# Patient Record
Sex: Male | Born: 1980 | Race: White | Hispanic: No | Marital: Single | Smoking: Current every day smoker
Health system: Southern US, Community
[De-identification: ages and names within clinical notes are randomized; demographics above are authoritative.]

## PROBLEM LIST (undated history)

## (undated) DIAGNOSIS — I10 Essential (primary) hypertension: Secondary | ICD-10-CM

## (undated) DIAGNOSIS — K5792 Diverticulitis of intestine, part unspecified, without perforation or abscess without bleeding: Secondary | ICD-10-CM

## (undated) DIAGNOSIS — E119 Type 2 diabetes mellitus without complications: Secondary | ICD-10-CM

## (undated) HISTORY — PX: KNEE ARTHROPLASTY: SHX992

## (undated) HISTORY — PX: WRIST SURGERY: SHX841

## (undated) HISTORY — PX: FOOT SURGERY: SHX648

---

## 1998-05-26 ENCOUNTER — Encounter: Payer: Self-pay | Admitting: Emergency Medicine

## 1998-05-26 ENCOUNTER — Emergency Department (HOSPITAL_COMMUNITY): Admission: EM | Admit: 1998-05-26 | Discharge: 1998-05-26 | Payer: Self-pay | Admitting: Emergency Medicine

## 1998-09-09 ENCOUNTER — Ambulatory Visit (HOSPITAL_COMMUNITY): Admission: RE | Admit: 1998-09-09 | Discharge: 1998-09-09 | Payer: Self-pay | Admitting: *Deleted

## 2010-11-13 ENCOUNTER — Emergency Department (HOSPITAL_COMMUNITY)
Admission: EM | Admit: 2010-11-13 | Discharge: 2010-11-13 | Disposition: A | Discharge status: Home / Self Care | Payer: Self-pay | Attending: Emergency Medicine | Admitting: Emergency Medicine

## 2010-11-13 DIAGNOSIS — R209 Unspecified disturbances of skin sensation: Secondary | ICD-10-CM | POA: Insufficient documentation

## 2010-11-13 DIAGNOSIS — M5430 Sciatica, unspecified side: Secondary | ICD-10-CM | POA: Insufficient documentation

## 2010-11-13 DIAGNOSIS — M545 Low back pain, unspecified: Secondary | ICD-10-CM | POA: Insufficient documentation

## 2010-11-13 DIAGNOSIS — M79609 Pain in unspecified limb: Secondary | ICD-10-CM | POA: Insufficient documentation

## 2015-04-05 ENCOUNTER — Emergency Department (HOSPITAL_COMMUNITY)
Admission: EM | Admit: 2015-04-05 | Discharge: 2015-04-05 | Disposition: A | Discharge status: Home / Self Care | Payer: BLUE CROSS/BLUE SHIELD | Attending: Emergency Medicine | Admitting: Emergency Medicine

## 2015-04-05 ENCOUNTER — Encounter (HOSPITAL_COMMUNITY): Payer: Self-pay | Admitting: Emergency Medicine

## 2015-04-05 DIAGNOSIS — K088 Other specified disorders of teeth and supporting structures: Secondary | ICD-10-CM | POA: Diagnosis present

## 2015-04-05 DIAGNOSIS — Z720 Tobacco use: Secondary | ICD-10-CM | POA: Insufficient documentation

## 2015-04-05 DIAGNOSIS — K029 Dental caries, unspecified: Secondary | ICD-10-CM | POA: Diagnosis not present

## 2015-04-05 MED ORDER — HYDROCODONE-ACETAMINOPHEN 5-325 MG PO TABS: 1.0000 | ORAL_TABLET | ORAL | Status: DC | PRN

## 2015-04-05 MED ORDER — PENICILLIN V POTASSIUM 500 MG PO TABS: 500.0000 mg | ORAL_TABLET | Freq: Three times a day (TID) | ORAL | Status: DC

## 2015-04-05 MED ORDER — IBUPROFEN 800 MG PO TABS: 800.0000 mg | ORAL_TABLET | Freq: Three times a day (TID) | ORAL | Status: DC

## 2015-04-05 MED ORDER — OXYCODONE-ACETAMINOPHEN 5-325 MG PO TABS
1.0000 | ORAL_TABLET | Freq: Once | ORAL | Status: AC
  Administered 2015-04-05: 1 via ORAL
  Filled 2015-04-05: qty 1

## 2015-04-05 NOTE — Discharge Instructions (Signed)
Dental Care and Dentist Visits °Dental care supports good overall health. Regular dental visits can also help you avoid dental pain, bleeding, infection, and other more serious health problems in the future. It is important to keep the mouth healthy because diseases in the teeth, gums, and other oral tissues can spread to other areas of the body. Some problems, such as diabetes, heart disease, and pre-term labor have been associated with poor oral health.  °See your dentist every 6 months. If you experience emergency problems such as a toothache or broken tooth, go to the dentist right away. If you see your dentist regularly, you may catch problems early. It is easier to be treated for problems in the early stages.  °WHAT TO EXPECT AT A DENTIST VISIT  °Your dentist will look for many common oral health problems and recommend proper treatment. At your regular dental visit, you can expect: °· Gentle cleaning of the teeth and gums. This includes scraping and polishing. This helps to remove the sticky substance around the teeth and gums (plaque). Plaque forms in the mouth shortly after eating. Over time, plaque hardens on the teeth as tartar. If tartar is not removed regularly, it can cause problems. Cleaning also helps remove stains. °· Periodic X-rays. These pictures of the teeth and supporting bone will help your dentist assess the health of your teeth. °· Periodic fluoride treatments. Fluoride is a natural mineral shown to help strengthen teeth. Fluoride treatment involves applying a fluoride gel or varnish to the teeth. It is most commonly done in children. °· Examination of the mouth, tongue, jaws, teeth, and gums to look for any oral health problems, such as: °· Cavities (dental caries). This is decay on the tooth caused by plaque, sugar, and acid in the mouth. It is best to catch a cavity when it is small. °· Inflammation of the gums caused by plaque buildup (gingivitis). °· Problems with the mouth or malformed  or misaligned teeth. °· Oral cancer or other diseases of the soft tissues or jaws.  °KEEP YOUR TEETH AND GUMS HEALTHY °For healthy teeth and gums, follow these general guidelines as well as your dentist's specific advice: °· Have your teeth professionally cleaned at the dentist every 6 months. °· Brush twice daily with a fluoride toothpaste. °· Floss your teeth daily.  °· Ask your dentist if you need fluoride supplements, treatments, or fluoride toothpaste. °· Eat a healthy diet. Reduce foods and drinks with added sugar. °· Avoid smoking. °TREATMENT FOR ORAL HEALTH PROBLEMS °If you have oral health problems, treatment varies depending on the conditions present in your teeth and gums. °· Your caregiver will most likely recommend good oral hygiene at each visit. °· For cavities, gingivitis, or other oral health disease, your caregiver will perform a procedure to treat the problem. This is typically done at a separate appointment. Sometimes your caregiver will refer you to another dental specialist for specific tooth problems or for surgery. °SEEK IMMEDIATE DENTAL CARE IF: °· You have pain, bleeding, or soreness in the gum, tooth, jaw, or mouth area. °· A permanent tooth becomes loose or separated from the gum socket. °· You experience a blow or injury to the mouth or jaw area. °Document Released: 05/31/2011 Document Revised: 12/11/2011 Document Reviewed: 05/31/2011 °ExitCare® Patient Information ©2015 [employer]. This information is not intended to replace advice given to you by your health care provider. Make sure you discuss any questions you have with your health care provider. ° °Dental Caries °Dental caries is tooth decay. This   decay can cause a hole in teeth (cavity) that can get bigger and deeper over time. °HOME CARE °· Brush and floss your teeth. Do this at least two times a day. °· Use a fluoride toothpaste. °· Use a mouth rinse if told by your dentist or doctor. °· Eat less sugary and starchy foods.  Drink less sugary drinks. °· Avoid snacking often on sugary and starchy foods. Avoid sipping often on sugary drinks. °· Keep regular checkups and cleanings with your dentist. °· Use fluoride supplements if told by your dentist or doctor. °· Allow fluoride to be applied to teeth if told by your dentist or doctor. °Document Released: 06/27/2008 Document Revised: 02/02/2014 Document Reviewed: 09/20/2012 °ExitCare® Patient Information ©2015 [employer]. This information is not intended to replace advice given to you by your health care provider. Make sure you discuss any questions you have with your health care provider. ° °Dental Pain °A tooth ache may be caused by cavities (tooth decay). Cavities expose the nerve of the tooth to air and hot or cold temperatures. It may come from an infection or abscess (also called a boil or furuncle) around your tooth. It is also often caused by dental caries (tooth decay). This causes the pain you are having. °DIAGNOSIS  °Your caregiver can diagnose this problem by exam. °TREATMENT  °· If caused by an infection, it may be treated with medications which kill germs (antibiotics) and pain medications as prescribed by your caregiver. Take medications as directed. °· Only take over-the-counter or prescription medicines for pain, discomfort, or fever as directed by your caregiver. °· Whether the tooth ache today is caused by infection or dental disease, you should see your dentist as soon as possible for further care. °SEEK MEDICAL CARE IF: °The exam and treatment you received today has been provided on an emergency basis only. This is not a substitute for complete medical or dental care. If your problem worsens or new problems (symptoms) appear, and you are unable to meet with your dentist, call or return to this location. °SEEK IMMEDIATE MEDICAL CARE IF:  °· You have a fever. °· You develop redness and swelling of your face, jaw, or neck. °· You are unable to open your mouth. °· You  have severe pain uncontrolled by pain medicine. °MAKE SURE YOU:  °· Understand these instructions. °· Will watch your condition. °· Will get help right away if you are not doing well or get worse. °Document Released: 09/18/2005 Document Revised: 12/11/2011 Document Reviewed: 05/06/2008 °ExitCare® Patient Information ©2015 [employer]. This information is not intended to replace advice given to you by your health care provider. Make sure you discuss any questions you have with your health care provider. ° °Emergency Department Resource Guide °1) Find a Doctor and Pay Out of Pocket °Although you won't have to find out who is covered by your insurance plan, it is a good idea to ask around and get recommendations. You will then need to call the office and see if the doctor you have chosen will accept you as a new patient and what types of options they offer for patients who are self-pay. Some doctors offer discounts or will set up payment plans for their patients who do not have insurance, but you will need to ask so you aren't surprised when you get to your appointment. ° °2) Contact Your Local Health Department °Not all health departments have doctors that can see patients for sick visits, but many do, so it is worth   a call to see if yours does. If you don't know where your local health department is, you can check in your phone book. The CDC also has a tool to help you locate your state's health department, and many state websites also have listings of all of their local health departments. ° °3) Find a Walk-in Clinic °If your illness is not likely to be very severe or complicated, you may want to try a walk in clinic. These are popping up all over the country in pharmacies, drugstores, and shopping centers. They're usually staffed by nurse practitioners or physician assistants that have been trained to treat common illnesses and complaints. They're usually fairly quick and inexpensive. However, if you have serious  medical issues or chronic medical problems, these are probably not your best option. ° °No Primary Care Doctor: °- Call [facility] at  832-8000 - they can help you locate a primary care doctor that  accepts your insurance, provides certain services, etc. °- Physician Referral Service- 1-800-533-3463 ° °Chronic Pain Problems: °Organization         Address  Phone   Notes  °[facility]  ([phone]) [phone] Patients need to be referred by their primary care doctor.  ° °Medication Assistance: °Organization         Address  Phone   Notes  °[place] [facility] [address] °[place], [place] [address] (336) 641-8030 --Must be a resident of [place] °-- Must have NO insurance coverage whatsoever (no Medicaid/ Medicare, etc.) °-- The pt. MUST have a primary care doctor that directs their care regularly and follows them in the community °  °[facility]  (866) 331-1348   °[employer]  (888) 892-1162   ° °Agencies that provide inexpensive medical care: °Organization         Address  Phone   Notes  °[facility]  ([phone]) [phone]   °[facility]    (336) 832-7272   °[facility] [address] °[place], [place] [address] (336) 832-4777   °[facility] [address], °[place] (336) 271-4999   °[facility]    (336) 373-0678   °[facility]    (336) 272-1050   °[facility] ° [address], [place] Phone:  (336) 832-4444, Fax:  (336) 832-4440 Hours of Operation:  9 am - 6 pm, M-F.  Also accepts Medicaid/Medicare and self-pay.  °[facility] ° [address], [place] Phone: (336) [phone], Fax: (336) [phone]. Hours of Operation:  8:30 am - 5:30 pm, M-F.  Also accepts Medicaid and self-pay.  °[facility] [place] [address], [place] Phone: (336) [phone]   °[facility] [address], [place], [place] (336)723-1848, Ext. 123 Mondays & Thursdays: 7-9 AM.  First 15 patients are seen on a first come, first serve basis. °  ° °Medicaid-accepting [place] Providers: ° °Organization         Address  Phone   Notes  °[facility] [address], [place] ([phone]) [phone] Also accepts self-pay patients.  °[facility] [address], [place] ° (336) [phone]   °[facility] [address], [place] (336) [phone]   °[facility] [address] [place] [address], [place] (336) [phone]   °[facility] [address], [place]  ° ([phone]) [phone] Only accepts [place] Access Medicaid patients after they have their name applied to their card.  ° °Self-Pay (no insurance) in [place]: ° °Organization         Address  Phone   Notes  °Sickle Cell Patients, [facility] [address], [place] (336) [phone]   °[facility] [address], [place] (336) [phone]   °[facility] ° [address],  (336) [phone]   °[facility]/Dr. Osei-Bonsu ° [address] [place] [address], [place] or [address], [place] ([phone]) [phone] Phone number for both [place] and [place] locations is the same.  °[facility] [address], [place] ([phone]) [phone]   °[facility] [address] [place] [address], [place] or [address] (336) 852-7530 °(336) 878-2260   °[facility] [address], [place] (336) 350-1642, phone; (336) [phone], fax Sees patients 1st and 3rd Saturday of every month.  Must not qualify for public or private insurance (i.e. Medicaid, Medicare, [place] Health Choice, Veterans' Benefits) • Household income should be no more than 200% of the poverty level •The clinic cannot treat you if you are pregnant or think you are pregnant • Sexually  transmitted diseases are not treated at the clinic.  ° ° °Dental Care: °Organization         Address  Phone  Notes  °[facility] [address], [place] (336) [phone] Accepts children up to age 21 who are enrolled in Medicaid or [place] Health Choice; pregnant women with a Medicaid card; and children who have applied for Medicaid or [place] Health Choice, but were declined, whose parents can pay a reduced fee at time of service.  °[facility] [place]  [address], [place] (336) 641-7733 Accepts children up to age 21 who are enrolled in Medicaid or [place] Health Choice; pregnant women with a Medicaid card; and children who have applied for Medicaid or [place] Health Choice, but were declined, whose parents can pay a reduced fee at time of service.  °[facility] ° [address], [place] (336) [phone] Patients are seen by appointment only. Walk-ins are not accepted. [facility] will see patients 18 years of age and older. °Monday - Tuesday (8am-5pm) °Most Wednesdays (8:30-5pm) °$30 per visit, cash only  °[facility] ° [address], [place] ([phone]) [phone] Patients are seen by appointment only. Walk-ins are not accepted. [facility] will see patients 18 years of age and older. °One Wednesday Evening (Monthly: Volunteer Based).  $30 per visit, cash only  °[facility]  ([phone]) [phone] for adults; Children under age 4, call Graduate Pediatric Dentistry at ([phone]) [phone]. Children aged 4-14, please call ([phone]) [phone] to request a pediatric application. ° Dental services are provided in all areas of dental care including fillings, crowns and bridges, complete and partial dentures, implants, gum treatment, root canals, and extractions. Preventive care is also provided. Treatment is provided to both adults and children. °Patients are  selected via a lottery and there is often a waiting list. °  °[facility] [address], ° ° (336) 763-8833 [email or web address] °  °[facility] [address], [place], [place] (336)723-1848, Ext. 123 Second and Fourth Thursday of each   month, opens at 6:30 AM; Clinic ends at 9 AM.  Patients are seen on a first-come first-served basis, and a limited number are seen during each clinic.  ° °[facility] ° [address], [place], [place] ([phone]) [phone]   Eligibility Requirements °You must have lived in [place], [place], or [place] counties for at least the last three months. °  You cannot be eligible for state or federal sponsored healthcare insurance, including Veterans Administration, Medicaid, or Medicare. °  You generally cannot be eligible for healthcare insurance through your employer.  °  How to apply: °Eligibility screenings are held every Tuesday and Wednesday afternoon from 1:00 pm until 4:00 pm. You do not need an appointment for the interview!  °[facility] [address], [place], [place] [phone]   °[facility]  [phone]   °[facility]  [phone]   °[facility]  [phone]   ° °

## 2015-04-05 NOTE — ED Provider Notes (Signed)
CSN: [id number]     Arrival date & time 04/05/15  0247 History   First MD Initiated Contact with Patient 04/05/15 [phone]     Chief Complaint  Patient presents with  . Dental Pain     (Consider location/radiation/quality/duration/timing/severity/associated sxs/prior Treatment) Patient is a 34 y.o. male presenting with tooth pain. The history is provided by the patient. No language interpreter was used.  Dental Pain Location:  Upper Associated symptoms: no facial swelling and no fever   Associated symptoms comment:  Patient with multiple severe caries with pain in upper left molars x 2 weeks. No facial swelling. Increasing pain tonight. No fever.    No past medical history on file. Past Surgical History  Procedure Laterality Date  . Knee arthroplasty    . Foot surgery     No family history on file. History  Substance Use Topics  . Smoking status: Current Every Day Smoker  . Smokeless tobacco: Not on file  . Alcohol Use: No    Review of Systems  Constitutional: Negative for fever and chills.  HENT: Negative for ear pain, facial swelling and trouble swallowing.        Toothache.  Gastrointestinal: Negative.  Negative for vomiting.  Neurological: Negative.       Allergies  Codeine  Home Medications   Prior to Admission medications   Not on File   BP 157/106 mmHg  Pulse 54  Temp(Src) 97.6 F (36.4 C) (Oral)  Resp 16  Ht 6\' 1"  (1.854 m)  Wt 246 lb (111.585 kg)  BMI 32.46 kg/m2  SpO2 98% Physical Exam  Constitutional: He is oriented to person, place, and time. He appears well-developed and well-nourished.  HENT:  Widespread dental decay upper and lower molars with tenderness.   Neck: Normal range of motion.  Pulmonary/Chest: Effort normal.  Musculoskeletal: Normal range of motion.  Neurological: He is alert and oriented to person, place, and time.  Skin: Skin is warm and dry.  Psychiatric: He has a normal mood and affect.    ED Course  Procedures (including  critical care time) Labs Review Labs Reviewed - No data to display  Imaging Review No results found.   EKG Interpretation None      MDM   Final diagnoses:  None    1. Dental caries 2. Dental pain  Uncomplicated dental pain with significant and multiple caries requiring dental care.     Elpidio AnisShari , PA-C 04/05/15 [id number]  Toy CookeyMegan Docherty, MD 04/08/15 1150

## 2015-04-05 NOTE — ED Notes (Signed)
Pt. reports left upper/lower dental pain onset this week unrelieved by OTC Ibuprofen .

## 2015-10-22 ENCOUNTER — Emergency Department (HOSPITAL_COMMUNITY)
Admission: EM | Admit: 2015-10-22 | Discharge: 2015-10-22 | Disposition: A | Discharge status: Home / Self Care | Payer: BLUE CROSS/BLUE SHIELD | Attending: Emergency Medicine | Admitting: Emergency Medicine

## 2015-10-22 ENCOUNTER — Emergency Department (HOSPITAL_COMMUNITY): Payer: BLUE CROSS/BLUE SHIELD

## 2015-10-22 ENCOUNTER — Encounter (HOSPITAL_COMMUNITY): Payer: Self-pay | Admitting: *Deleted

## 2015-10-22 DIAGNOSIS — Z8719 Personal history of other diseases of the digestive system: Secondary | ICD-10-CM | POA: Diagnosis not present

## 2015-10-22 DIAGNOSIS — F17210 Nicotine dependence, cigarettes, uncomplicated: Secondary | ICD-10-CM | POA: Diagnosis not present

## 2015-10-22 DIAGNOSIS — J40 Bronchitis, not specified as acute or chronic: Secondary | ICD-10-CM

## 2015-10-22 DIAGNOSIS — J209 Acute bronchitis, unspecified: Secondary | ICD-10-CM | POA: Insufficient documentation

## 2015-10-22 DIAGNOSIS — R110 Nausea: Secondary | ICD-10-CM | POA: Diagnosis not present

## 2015-10-22 DIAGNOSIS — R05 Cough: Secondary | ICD-10-CM | POA: Diagnosis present

## 2015-10-22 DIAGNOSIS — J019 Acute sinusitis, unspecified: Secondary | ICD-10-CM | POA: Diagnosis not present

## 2015-10-22 HISTORY — DX: Diverticulitis of intestine, part unspecified, without perforation or abscess without bleeding: K57.92

## 2015-10-22 MED ORDER — AZITHROMYCIN 250 MG PO TABS: 250.0000 mg | ORAL_TABLET | Freq: Every day | ORAL | Status: DC

## 2015-10-22 MED ORDER — HYDROCODONE-HOMATROPINE 5-1.5 MG/5ML PO SYRP: 5.0000 mL | ORAL_SOLUTION | Freq: Four times a day (QID) | ORAL | Status: DC | PRN

## 2015-10-22 MED ORDER — PREDNISONE 20 MG PO TABS: 40.0000 mg | ORAL_TABLET | Freq: Every day | ORAL | Status: DC

## 2015-10-22 MED ORDER — ALBUTEROL SULFATE HFA 108 (90 BASE) MCG/ACT IN AERS: 2.0000 | INHALATION_SPRAY | RESPIRATORY_TRACT | Status: AC | PRN

## 2015-10-22 MED ORDER — BENZONATATE 100 MG PO CAPS: 100.0000 mg | ORAL_CAPSULE | Freq: Three times a day (TID) | ORAL | Status: DC

## 2015-10-22 NOTE — Discharge Instructions (Signed)
Sinusitis, Adult Sinusitis is redness, soreness, and puffiness (inflammation) of the air pockets in the bones of your face (sinuses). The redness, soreness, and puffiness can cause air and mucus to get trapped in your sinuses. This can allow germs to grow and cause an infection.  HOME CARE   Drink enough fluids to keep your pee (urine) clear or pale yellow.  Use a humidifier in your home.  Run a hot shower to create steam in the bathroom. Sit in the bathroom with the door closed. Breathe in the steam 3-4 times a day.  Put a warm, moist washcloth on your face 3-4 times a day, or as told by your doctor.  Use salt water sprays (saline sprays) to wet the thick fluid in your nose. This can help the sinuses drain.  Only take medicine as told by your doctor. GET HELP RIGHT AWAY IF:   Your pain gets worse.  You have very bad headaches.  You are sick to your stomach (nauseous).  You throw up (vomit).  You are very sleepy (drowsy) all the time.  Your face is puffy (swollen).  Your vision changes.  You have a stiff neck.  You have trouble breathing. MAKE SURE YOU:   Understand these instructions.  Will watch your condition.  Will get help right away if you are not doing well or get worse.   This information is not intended to replace advice given to you by your health care provider. Make sure you discuss any questions you have with your health care provider.   Document Released: 03/06/2008 Document Revised: 10/09/2014 Document Reviewed: 04/23/2012 Elsevier Interactive Patient Education 2016 Elsevier Inc. Acute Bronchitis Bronchitis is inflammation of the airways that extend from the windpipe into the lungs (bronchi). The inflammation often causes mucus to develop. This leads to a cough, which is the most common symptom of bronchitis.  In acute bronchitis, the condition usually develops suddenly and goes away over time, usually in a couple weeks. Smoking, allergies, and asthma  can make bronchitis worse. Repeated episodes of bronchitis may cause further lung problems.  CAUSES Acute bronchitis is most often caused by the same virus that causes a cold. The virus can spread from person to person (contagious) through coughing, sneezing, and touching contaminated objects. SIGNS AND SYMPTOMS   Cough.   Fever.   Coughing up mucus.   Body aches.   Chest congestion.   Chills.   Shortness of breath.   Sore throat.  DIAGNOSIS  Acute bronchitis is usually diagnosed through a physical exam. Your health care provider will also ask you questions about your medical history. Tests, such as chest X-rays, are sometimes done to rule out other conditions.  TREATMENT  Acute bronchitis usually goes away in a couple weeks. Oftentimes, no medical treatment is necessary. Medicines are sometimes given for relief of fever or cough. Antibiotic medicines are usually not needed but may be prescribed in certain situations. In some cases, an inhaler may be recommended to help reduce shortness of breath and control the cough. A cool mist vaporizer may also be used to help thin bronchial secretions and make it easier to clear the chest.  HOME CARE INSTRUCTIONS  Get plenty of rest.   Drink enough fluids to keep your urine clear or pale yellow (unless you have a medical condition that requires fluid restriction). Increasing fluids may help thin your respiratory secretions (sputum) and reduce chest congestion, and it will prevent dehydration.   Take medicines only as directed by your  health care provider.  If you were prescribed an antibiotic medicine, finish it all even if you start to feel better.  Avoid smoking and secondhand smoke. Exposure to cigarette smoke or irritating chemicals will make bronchitis worse. If you are a smoker, consider using nicotine gum or skin patches to help control withdrawal symptoms. Quitting smoking will help your lungs heal faster.   Reduce the  chances of another bout of acute bronchitis by washing your hands frequently, avoiding people with cold symptoms, and trying not to touch your hands to your mouth, nose, or eyes.   Keep all follow-up visits as directed by your health care provider.  SEEK MEDICAL CARE IF: Your symptoms do not improve after 1 week of treatment.  SEEK IMMEDIATE MEDICAL CARE IF:  You develop an increased fever or chills.   You have chest pain.   You have severe shortness of breath.  You have bloody sputum.   You develop dehydration.  You faint or repeatedly feel like you are going to pass out.  You develop repeated vomiting.  You develop a severe headache. MAKE SURE YOU:   Understand these instructions.  Will watch your condition.  Will get help right away if you are not doing well or get worse.   This information is not intended to replace advice given to you by your health care provider. Make sure you discuss any questions you have with your health care provider.   Document Released: 10/26/2004 Document Revised: 10/09/2014 Document Reviewed: 03/11/2013 Elsevier Interactive Patient Education [employer]

## 2015-10-22 NOTE — ED Provider Notes (Signed)
CSN: [id number]     Arrival date & time 10/22/15  0143 History   First MD Initiated Contact with Patient 10/22/15 0449     Chief Complaint  Patient presents with  . Migraine  . Cough  . Nasal Congestion     (Consider location/radiation/quality/duration/timing/severity/associated sxs/prior Treatment) HPI Comments: Patient presents to the ER for evaluation of headache, nasal congestion, sore throat and cough. Patient reports that he has had the sinus congestion, headache and sore throat for the last 3 days, but his cough has been going on for 3 months. He feels like the cough has worsened and it is making his head hurt more. He started to feel nausea today but did not have any vomiting. He has not had diarrhea. There is no neck pain or stiffness. He has not had fever.  Patient is a 35 y.o. male presenting with migraines and cough.  Migraine Associated symptoms include headaches.  Cough Associated symptoms: headaches and sore throat   Associated symptoms: no fever     Past Medical History  Diagnosis Date  . Diverticulitis    Past Surgical History  Procedure Laterality Date  . Knee arthroplasty    . Foot surgery    . Wrist surgery     No family history on file. Social History  Substance Use Topics  . Smoking status: Current Every Day Smoker -- 0.50 packs/day    Types: Cigarettes  . Smokeless tobacco: None  . Alcohol Use: No    Review of Systems  Constitutional: Negative for fever.  HENT: Positive for congestion and sore throat.   Respiratory: Positive for cough.   Gastrointestinal: Positive for nausea. Negative for vomiting.  Neurological: Positive for headaches.  All other systems reviewed and are negative.     Allergies  Codeine  Home Medications   Prior to Admission medications   Medication Sig Start Date End Date Taking? Authorizing Provider  ibuprofen (ADVIL,MOTRIN) 200 MG tablet Take 1,600 mg by mouth every 6 (six) hours as needed for moderate pain.   Yes  Historical Provider, MD   BP 127/87 mmHg  Pulse 68  Temp(Src) 97.6 F (36.4 C) (Oral)  Resp 16  SpO2 98% Physical Exam  Constitutional: He is oriented to person, place, and time. He appears well-developed and well-nourished. No distress.  HENT:  Head: Normocephalic and atraumatic.  Right Ear: Hearing normal.  Left Ear: Hearing normal.  Nose: Right sinus exhibits maxillary sinus tenderness. Left sinus exhibits maxillary sinus tenderness.  Mouth/Throat: Oropharynx is clear and moist and mucous membranes are normal.  Eyes: Conjunctivae and EOM are normal. Pupils are equal, round, and reactive to light.  Neck: Normal range of motion. Neck supple.  Cardiovascular: Regular rhythm, S1 normal and S2 normal.  Exam reveals no gallop and no friction rub.   No murmur heard. Pulmonary/Chest: Effort normal and breath sounds normal. No respiratory distress. He exhibits no tenderness.  Abdominal: Soft. Normal appearance and bowel sounds are normal. There is no hepatosplenomegaly. There is no tenderness. There is no rebound, no guarding, no tenderness at McBurney's point and negative Murphy's sign. No hernia.  Musculoskeletal: Normal range of motion.  Neurological: He is alert and oriented to person, place, and time. He has normal strength. No cranial nerve deficit or sensory deficit. Coordination normal. GCS eye subscore is 4. GCS verbal subscore is 5. GCS motor subscore is 6.  Skin: Skin is warm, dry and intact. No rash noted. No cyanosis.  Psychiatric: He has a normal mood and affect.  His speech is normal and behavior is normal. Thought content normal.  Nursing note and vitals reviewed.   ED Course  Procedures (including critical care time) Labs Review Labs Reviewed - No data to display  Imaging Review Dg Chest 2 View  10/22/2015  CLINICAL DATA:  35 year old male with cough EXAM: CHEST  2 VIEW COMPARISON:  Radiograph dated 05/15/2013 FINDINGS: The heart size and mediastinal contours are within  normal limits. Both lungs are clear. The visualized skeletal structures are unremarkable. IMPRESSION: No active cardiopulmonary disease. Electronically Signed   By: Elgie Collard M.D.   On: 10/22/2015 05:35   I have personally reviewed and evaluated these images and lab results as part of my medical decision-making.   EKG Interpretation None      MDM   Final diagnoses:  None   sinusitis  Patient presents to the emergency part for evaluation of upper respiratory infection symptoms. Patient reports that he has been experiencing headache with sinus congestion and sore throat for several days, but he has had a cough for several months. He is a smoker. He does not have any diagnosed history of COPD or asthma. No wheezing currently. Chest x-ray does not show any evidence of pneumonia or other significant abnormality.    Gilda Crease, MD 10/22/15 [phone]

## 2015-10-22 NOTE — ED Notes (Signed)
Pt c/o migraine and congestion x 3 days and cough x 3 months.

## 2015-10-22 NOTE — ED Notes (Signed)
Patient transported to X-ray 

## 2017-10-23 ENCOUNTER — Other Ambulatory Visit: Payer: Self-pay

## 2017-10-23 ENCOUNTER — Encounter (HOSPITAL_COMMUNITY): Payer: Self-pay | Admitting: Emergency Medicine

## 2017-10-23 ENCOUNTER — Emergency Department (HOSPITAL_COMMUNITY)
Admission: EM | Admit: 2017-10-23 | Discharge: 2017-10-23 | Disposition: A | Discharge status: Home / Self Care | Payer: BLUE CROSS/BLUE SHIELD | Attending: Emergency Medicine | Admitting: Emergency Medicine

## 2017-10-23 DIAGNOSIS — Z79899 Other long term (current) drug therapy: Secondary | ICD-10-CM | POA: Insufficient documentation

## 2017-10-23 DIAGNOSIS — F17210 Nicotine dependence, cigarettes, uncomplicated: Secondary | ICD-10-CM | POA: Insufficient documentation

## 2017-10-23 DIAGNOSIS — J0100 Acute maxillary sinusitis, unspecified: Secondary | ICD-10-CM | POA: Insufficient documentation

## 2017-10-23 DIAGNOSIS — K047 Periapical abscess without sinus: Secondary | ICD-10-CM | POA: Insufficient documentation

## 2017-10-23 LAB — I-STAT TROPONIN, ED: Troponin i, poc: 0 ng/mL (ref 0.00–0.08)

## 2017-10-23 MED ORDER — KETOROLAC TROMETHAMINE 30 MG/ML IJ SOLN
30.0000 mg | Freq: Once | INTRAMUSCULAR | Status: AC
  Administered 2017-10-23: 30 mg via INTRAVENOUS
  Filled 2017-10-23: qty 1

## 2017-10-23 MED ORDER — FLUTICASONE PROPIONATE 50 MCG/ACT NA SUSP: 2.0000 | Freq: Every day | NASAL | 2 refills | Status: DC

## 2017-10-23 MED ORDER — TRAMADOL HCL 50 MG PO TABS: 50.0000 mg | ORAL_TABLET | Freq: Four times a day (QID) | ORAL | 0 refills | Status: DC | PRN

## 2017-10-23 MED ORDER — LORATADINE 10 MG PO TABS: 10.0000 mg | ORAL_TABLET | Freq: Every day | ORAL | 0 refills | Status: DC

## 2017-10-23 MED ORDER — AMOXICILLIN-POT CLAVULANATE 875-125 MG PO TABS: 1.0000 | ORAL_TABLET | Freq: Two times a day (BID) | ORAL | 0 refills | Status: DC

## 2017-10-23 NOTE — ED Triage Notes (Signed)
Onset 12:30 this am, woke up with left sided jaw pain, dizziness

## 2017-10-23 NOTE — ED Provider Notes (Signed)
[facility] EMERGENCY DEPARTMENT Provider Note   CSN: [id number] Arrival date & time: 10/23/17  1208     History   Chief Complaint Chief Complaint  Patient presents with  . Jaw Pain    HPI Reginald Black is a 37 y.o. male.  HPI With left jaw and facial pain that he noticed this morning around 1230.  States he has nasal congestion and left-sided sinus pressure.  He also has multiple missing teeth and dental pain.  Has taken ibuprofen with minimal relief.  No chest pain or shortness of breath.  No fever or chills. Past Medical History:  Diagnosis Date  . Diverticulitis     There are no active problems to display for this patient.   Past Surgical History:  Procedure Laterality Date  . FOOT SURGERY    . KNEE ARTHROPLASTY    . WRIST SURGERY         Home Medications    Prior to Admission medications   Medication Sig Start Date End Date Taking? Authorizing Provider  celecoxib (CELEBREX) 100 MG capsule Take 1 capsule by mouth 2 (two) times daily. 02/28/17  Yes [provider]  tiZANidine (ZANAFLEX) 4 MG tablet Take 4 mg by mouth every 6 (six) hours as needed. 06/05/16  Yes [provider]  albuterol (PROVENTIL HFA;VENTOLIN HFA) 108 (90 Base) MCG/ACT inhaler Inhale 2 puffs into the lungs every 4 (four) hours as needed for wheezing or shortness of breath. 10/22/15   Gilda Crease, MD  amoxicillin-clavulanate (AUGMENTIN) 875-125 MG tablet Take 1 tablet by mouth 2 (two) times daily. One po bid x 7 days 10/23/17   Loren Racer, MD  azithromycin (ZITHROMAX) 250 MG tablet Take 1 tablet (250 mg total) by mouth daily. Take first 2 tablets together, then 1 every day until finished. 10/22/15   Gilda Crease, MD  benzonatate (TESSALON) 100 MG capsule Take 1 capsule (100 mg total) by mouth every 8 (eight) hours. 10/22/15   Gilda Crease, MD  fluticasone (FLONASE) 50 MCG/ACT nasal spray Place 2 sprays into both nostrils daily. 10/23/17    Loren Racer, MD  HYDROcodone-homatropine [facility]) 5-1.5 MG/5ML syrup Take 5 mLs by mouth every 6 (six) hours as needed for cough. 10/22/15   Gilda Crease, MD  ibuprofen (ADVIL,MOTRIN) 200 MG tablet Take 1,600 mg by mouth every 6 (six) hours as needed for moderate pain.    [provider]  loratadine (CLARITIN) 10 MG tablet Take 1 tablet (10 mg total) by mouth daily. 10/23/17   Loren Racer, MD  Multiple Vitamin (THERA) TABS Take 1 tablet by mouth daily.    [provider]  niacin 500 MG tablet Take 1 tablet by mouth daily.    [provider]  predniSONE (DELTASONE) 20 MG tablet Take 2 tablets (40 mg total) by mouth daily with breakfast. 10/22/15   Pollina, Canary Brim, MD  traMADol (ULTRAM) 50 MG tablet Take 1 tablet (50 mg total) by mouth every 6 (six) hours as needed for severe pain. 10/23/17   Loren Racer, MD  vitamin B-12 (CYANOCOBALAMIN) 1000 MCG tablet Take 1 tablet by mouth daily.    [provider]    Family History No family history on file.  Social History Social History   Tobacco Use  . Smoking status: Current Every Day Smoker    Packs/day: 0.50    Types: Cigarettes  . Smokeless tobacco: Former Engineer, water Use Topics  . Alcohol use: No  . Drug use: No  Allergies   Codeine   Review of Systems Review of Systems  Constitutional: Negative for chills and fever.  HENT: Positive for congestion, dental problem, sinus pressure and sinus pain. Negative for facial swelling and sore throat.   Eyes: Negative for visual disturbance.  Respiratory: Negative for cough and shortness of breath.   Cardiovascular: Negative for chest pain, palpitations and leg swelling.  Gastrointestinal: Negative for abdominal pain, constipation, diarrhea, nausea and vomiting.  Musculoskeletal: Negative for back pain and neck pain.  Skin: Negative for rash and wound.  Neurological: Positive for headaches. Negative for weakness and  numbness.  All other systems reviewed and are negative.    Physical Exam Updated Vital Signs BP (!) 167/116   Pulse 82   Temp 98 F (36.7 C) (Oral)   Resp 18   Ht 6\' 1"  (1.854 m)   Wt 129.3 kg (285 lb)   SpO2 97%   BMI 37.60 kg/m   Physical Exam  Constitutional: He is oriented to person, place, and time. He appears well-developed and well-nourished. No distress.  HENT:  Head: Normocephalic and atraumatic.  Mouth/Throat: Oropharynx is clear and moist.  Left maxillary tenderness to percussion.  Patient has multiple missing and broken teeth with tenderness to palpation.  No obvious masses or fluctuance.    Eyes: EOM are normal. Pupils are equal, round, and reactive to light.  Neck: Normal range of motion. Neck supple.  Cardiovascular: Normal rate and regular rhythm. Exam reveals no gallop and no friction rub.  No murmur heard. Pulmonary/Chest: Effort normal and breath sounds normal. No stridor. No respiratory distress. He has no wheezes. He has no rales. He exhibits no tenderness.  Abdominal: Soft. Bowel sounds are normal. There is no tenderness. There is no rebound and no guarding.  Musculoskeletal: Normal range of motion. He exhibits no edema or tenderness.  Lymphadenopathy:    He has no cervical adenopathy.  Neurological: He is alert and oriented to person, place, and time.  Moves all extremities without deficit.  Sensation intact.  Skin: Skin is warm and dry. Capillary refill takes less than 2 seconds. No rash noted. No erythema.  Psychiatric: He has a normal mood and affect. His behavior is normal.  Nursing note and vitals reviewed.    ED Treatments / Results  Labs (all labs ordered are listed, but only abnormal results are displayed) Labs Reviewed  I-STAT TROPONIN, ED    EKG  EKG Interpretation None       Radiology No results found.  Procedures Procedures (including critical care time)  Medications Ordered in ED Medications  ketorolac (TORADOL) 30  MG/ML injection 30 mg (not administered)     Initial Impression / Assessment and Plan / ED Course  I have reviewed the triage vital signs and the nursing notes.  Pertinent labs & imaging results that were available during my care of the patient were reviewed by me and considered in my medical decision making (see chart for details).     Symptoms likely due to dental abscess versus sinusitis.  Will start on antibiotics, antihistamine with nasal steroid.  Advised to follow-up with dentist.  Patient's blood pressure is elevated. Advised to establish care with PMD for BP recheck and management.   Final Clinical Impressions(s) / ED Diagnoses   Final diagnoses:  Dental abscess  Acute maxillary sinusitis, recurrence not specified    ED Discharge Orders        Ordered    amoxicillin-clavulanate (AUGMENTIN) 875-125 MG tablet  2 times daily  10/23/17 1420    fluticasone (FLONASE) 50 MCG/ACT nasal spray  Daily     10/23/17 1420    loratadine (CLARITIN) 10 MG tablet  Daily     10/23/17 1420    traMADol (ULTRAM) 50 MG tablet  Every 6 hours PRN     10/23/17 1420       Loren RacerYelverton, , MD 10/23/17 1420

## 2019-11-10 ENCOUNTER — Other Ambulatory Visit: Payer: Self-pay

## 2019-11-10 ENCOUNTER — Encounter (HOSPITAL_COMMUNITY): Payer: Self-pay | Admitting: Emergency Medicine

## 2019-11-10 ENCOUNTER — Emergency Department (HOSPITAL_COMMUNITY)
Admission: EM | Admit: 2019-11-10 | Discharge: 2019-11-10 | Disposition: A | Discharge status: Home / Self Care | Payer: Medicaid Other | Attending: Emergency Medicine | Admitting: Emergency Medicine

## 2019-11-10 DIAGNOSIS — Z5321 Procedure and treatment not carried out due to patient leaving prior to being seen by health care provider: Secondary | ICD-10-CM | POA: Insufficient documentation

## 2019-11-10 DIAGNOSIS — M545 Low back pain: Secondary | ICD-10-CM | POA: Insufficient documentation

## 2019-11-10 NOTE — ED Triage Notes (Signed)
Pt c/o lower back pain and left leg pain getting worse tonight no getting relief from ibuprofen.

## 2019-11-25 ENCOUNTER — Emergency Department (HOSPITAL_COMMUNITY)
Admission: EM | Admit: 2019-11-25 | Discharge: 2019-11-25 | Disposition: A | Discharge status: Home / Self Care | Payer: Self-pay | Attending: Emergency Medicine | Admitting: Emergency Medicine

## 2019-11-25 ENCOUNTER — Encounter (HOSPITAL_COMMUNITY): Payer: Self-pay | Admitting: Emergency Medicine

## 2019-11-25 ENCOUNTER — Other Ambulatory Visit: Payer: Self-pay

## 2019-11-25 DIAGNOSIS — Z79899 Other long term (current) drug therapy: Secondary | ICD-10-CM | POA: Insufficient documentation

## 2019-11-25 DIAGNOSIS — F17210 Nicotine dependence, cigarettes, uncomplicated: Secondary | ICD-10-CM | POA: Insufficient documentation

## 2019-11-25 DIAGNOSIS — M5442 Lumbago with sciatica, left side: Secondary | ICD-10-CM | POA: Insufficient documentation

## 2019-11-25 MED ORDER — PREDNISONE 10 MG (21) PO TBPK: ORAL_TABLET | Freq: Every day | ORAL | 0 refills | Status: DC

## 2019-11-25 MED ORDER — KETOROLAC TROMETHAMINE 30 MG/ML IJ SOLN
30.0000 mg | Freq: Once | INTRAMUSCULAR | Status: AC
  Administered 2019-11-25: 19:00:00 30 mg via INTRAMUSCULAR
  Filled 2019-11-25: qty 1

## 2019-11-25 NOTE — ED Provider Notes (Signed)
[facility] EMERGENCY DEPARTMENT Provider Note   CSN: [id number] Arrival date & time: 11/25/19  1634     History Chief Complaint  Patient presents with  . Back Pain    Reginald Black is a 39 y.o. male with past medical history significant for diverticulitis presents to emergency department today with chief complaint of intermittent back pain x 3 weeks.  Patient states he has a history of back pain and sciatica that has not bothered him in the last 6 years.  He states in the past he went to a chiropractor and getting adjustments helped with his pain.  Unfortunately 3 weeks ago he twisted his right ankle.  He has been favoring that leg and since then has noticed left lower back pain.  The pain is sharp.  It shoots down his leg.  He states it feels like the sciatica he had in the past.  He has been taking ibuprofen at home with minimal symptom relief.  He rates the pain 9 out of 10 in severity.  No ibuprofen today prior to arrival.  Denies fevers, weight loss, numbness/weakness of upper and lower extremities, bowel/bladder incontinence, urinary retention, history of cancer, saddle anesthesia, history of back surgery, history of IVDA.     Past Medical History:  Diagnosis Date  . Diverticulitis     There are no problems to display for this patient.   Past Surgical History:  Procedure Laterality Date  . FOOT SURGERY    . KNEE ARTHROPLASTY    . WRIST SURGERY         History reviewed. No pertinent family history.  Social History   Tobacco Use  . Smoking status: Current Every Day Smoker    Packs/day: 0.50    Types: Cigarettes  . Smokeless tobacco: Former Network engineer Use Topics  . Alcohol use: No  . Drug use: No    Home Medications Prior to Admission medications   Medication Sig Start Date End Date Taking? Authorizing Provider  albuterol (PROVENTIL HFA;VENTOLIN HFA) 108 (90 Base) MCG/ACT inhaler Inhale 2 puffs into the lungs every 4 (four) hours as  needed for wheezing or shortness of breath. 10/22/15   Orpah Greek, MD  ibuprofen (ADVIL,MOTRIN) 200 MG tablet Take 1,600 mg by mouth every 6 (six) hours as needed for moderate pain.    [provider]  Multiple Vitamin (THERA) TABS Take 1 tablet by mouth daily.    [provider]  niacin 500 MG tablet Take 1 tablet by mouth daily.    [provider]  predniSONE (STERAPRED UNI-PAK 21 TAB) 10 MG (21) TBPK tablet Take by mouth daily. Take 6 tabs by mouth daily  for 2 days, then 5 tabs for 2 days, then 4 tabs for 2 days, then 3 tabs for 2 days, 2 tabs for 2 days, then 1 tab by mouth daily for 2 days 11/25/19   ,  E, PA-C  vitamin B-12 (CYANOCOBALAMIN) 1000 MCG tablet Take 1 tablet by mouth daily.    [provider]  fluticasone (FLONASE) 50 MCG/ACT nasal spray Place 2 sprays into both nostrils daily. 10/23/17 11/25/19  Julianne Rice, MD  loratadine (CLARITIN) 10 MG tablet Take 1 tablet (10 mg total) by mouth daily. 10/23/17 11/25/19  Julianne Rice, MD    Allergies    Codeine  Review of Systems   Review of Systems All other systems are reviewed and are negative for acute change except as noted in the HPI.  Physical  Exam Updated Vital Signs BP (!) 169/109 (BP Location: Right Arm)   Pulse 79   Temp 98.3 F (36.8 C) (Oral)   Resp 18   SpO2 95%   Physical Exam Vitals and nursing note reviewed.  Constitutional:      General: He is not in acute distress.    Appearance: He is not ill-appearing.  HENT:     Head: Normocephalic and atraumatic.     Right Ear: Tympanic membrane and external ear normal.     Left Ear: Tympanic membrane and external ear normal.     Nose: Nose normal.     Mouth/Throat:     Mouth: Mucous membranes are moist.     Pharynx: Oropharynx is clear.  Eyes:     General: No scleral icterus.       Right eye: No discharge.        Left eye: No discharge.     Extraocular Movements: Extraocular movements intact.      Conjunctiva/sclera: Conjunctivae normal.     Pupils: Pupils are equal, round, and reactive to light.  Neck:     Vascular: No JVD.  Cardiovascular:     Rate and Rhythm: Normal rate and regular rhythm.     Pulses: Normal pulses.          Radial pulses are 2+ on the right side and 2+ on the left side.     Heart sounds: Normal heart sounds.  Pulmonary:     Comments: Lungs clear to auscultation in all fields. Symmetric chest rise. No wheezing, rales, or rhonchi. Abdominal:     Palpations: There is no mass.     Hernia: No hernia is present.     Comments: Abdomen is soft, non-distended, and non-tender in all quadrants. No rigidity, no guarding. No peritoneal signs.  Musculoskeletal:        General: Normal range of motion.     Cervical back: Normal range of motion.     Right lower leg: No edema.     Left lower leg: No edema.     Comments: Moving all extremities without signs of injury.  Pelvis is stable.  Ambulates with normal gait. Positive straight leg raise test on the left, negative on right.  Full range of motion of the T-spine and L-spine No tenderness to palpation of the spinous processes of the T-spine or L-spine No crepitus, deformity or step-offs Mild tenderness to palpation of the left  paraspinous muscles of the L-spine     Skin:    General: Skin is warm and dry.     Capillary Refill: Capillary refill takes less than 2 seconds.  Neurological:     Mental Status: He is oriented to person, place, and time.     GCS: GCS eye subscore is 4. GCS verbal subscore is 5. GCS motor subscore is 6.     Comments: Fluent speech, no facial droop.  Sensation grossly intact to light touch in the lower extremities bilaterally. No saddle anesthesias. Strength 5/5 with flexion and extension at the bilateral hips, knees, and ankles. No noted gait deficit. Coordination intact with heel to shin testing.   Psychiatric:        Behavior: Behavior normal.       ED Results / Procedures /  Treatments   Labs (all labs ordered are listed, but only abnormal results are displayed) Labs Reviewed - No data to display  EKG None  Radiology No results found.  Procedures Procedures (including critical care time)  Medications Ordered in ED Medications  ketorolac (TORADOL) 30 MG/ML injection 30 mg (30 mg Intramuscular Given 11/25/19 1833)    ED Course  I have reviewed the triage vital signs and the nursing notes.  Pertinent labs & imaging results that were available during my care of the patient were reviewed by me and considered in my medical decision making (see chart for details).    MDM Rules/Calculators/A&P                      Normal neurological exam, no evidence of urinary incontinence or retention, pain is consistently reproducible. There is no evidence of AAA or concern for dissection at this time.   Patient can walk but states is painful.  No loss of bowel or bladder control.  No concern for cauda equina.  No fever, night sweats, weight loss, h/o cancer, IVDU.  Pain treated here in the department with adequate improvement. RICE protocol and steroid dose pack indicated and discussed with patient. I have also discussed reasons to return immediately to the ER.  Patient expresses understanding and agrees with plan.  Patient's blood pressure was slightly elevated today suspect this is related partly to pain.  He states he has been told he has high blood pressure in the past.  He does not have a PCP.  Patient given resource list to help stop his care with PCP.  Recommend outpatient follow-up with neurosurgery for back pain if symptoms persist.   Portions of this note were generated with Dragon dictation software. Dictation errors may occur despite best attempts at proofreading.    Final Clinical Impression(s) / ED Diagnoses Final diagnoses:  Acute left-sided low back pain with left-sided sciatica    Rx / DC Orders ED Discharge Orders         Ordered    predniSONE  (STERAPRED UNI-PAK 21 TAB) 10 MG (21) TBPK tablet  Daily     11/25/19 1834           Sherene Sires, PA-C 11/25/19 1853    Lorre Nick, MD 11/26/19 [phone]

## 2019-11-25 NOTE — ED Triage Notes (Signed)
Pt arrives to ED from home with complaints of cute on chronic lower back pain for the past three weeks. Patient states that he has hx of bulging disc and sciatica and is having trouble getting any pain relief at home.

## 2019-11-25 NOTE — Discharge Instructions (Addendum)
You have been seen today for back pain. Please read and follow all provided instructions. Return to the emergency room for worsening condition or new concerning symptoms.    1. Medications:  Prescription sent to your pharmacy for a steroid Dosepak.  Please take this as prescribed.  The hope is that this will help the likely inflammation that is causing your pain.  Continue usual home medications Take medications as prescribed. Please review all of the medicines and only take them if you do not have an allergy to them.   2. Treatment: rest, drink plenty of fluids  3. Follow Up:   -Recommend you follow-up with the orthopedic office that is in the past.  Please call their office to try to schedule a follow-up appointment. -You are unable to follow-up with the orthopedist he can also try following up with [place] [facility].  I have given you their contact information in your discharge paperwork.  You can try calling your office and when you do please mention this is an emergency department follow-up visit.  Please follow up with primary care provider by scheduling an appointment as soon as possible for a visit  If you do not have a primary care physician, contact [facility] at [phone] for referral   It is also a possibility that you have an allergic reaction to any of the medicines that you have been prescribed - Everybody reacts differently to medications and while MOST people have no trouble with most medicines, you may have a reaction such as nausea, vomiting, rash, swelling, shortness of breath. If this is the case, please stop taking the medicine immediately and contact your physician.  ?

## 2019-12-22 ENCOUNTER — Emergency Department (HOSPITAL_COMMUNITY)
Admission: EM | Admit: 2019-12-22 | Discharge: 2019-12-22 | Disposition: A | Discharge status: Home / Self Care | Payer: Self-pay | Attending: Emergency Medicine | Admitting: Emergency Medicine

## 2019-12-22 ENCOUNTER — Other Ambulatory Visit: Payer: Self-pay

## 2019-12-22 ENCOUNTER — Encounter (HOSPITAL_COMMUNITY): Payer: Self-pay | Admitting: Emergency Medicine

## 2019-12-22 DIAGNOSIS — M5442 Lumbago with sciatica, left side: Secondary | ICD-10-CM | POA: Insufficient documentation

## 2019-12-22 DIAGNOSIS — M79605 Pain in left leg: Secondary | ICD-10-CM | POA: Insufficient documentation

## 2019-12-22 DIAGNOSIS — F17210 Nicotine dependence, cigarettes, uncomplicated: Secondary | ICD-10-CM | POA: Insufficient documentation

## 2019-12-22 DIAGNOSIS — Z79899 Other long term (current) drug therapy: Secondary | ICD-10-CM | POA: Insufficient documentation

## 2019-12-22 LAB — CBC WITH DIFFERENTIAL/PLATELET
Abs Immature Granulocytes: 0.01 10*3/uL (ref 0.00–0.07)
Basophils Absolute: 0 10*3/uL (ref 0.0–0.1)
Basophils Relative: 1 %
Eosinophils Absolute: 0.1 10*3/uL (ref 0.0–0.5)
Eosinophils Relative: 2 %
HCT: 47.2 % (ref 39.0–52.0)
Hemoglobin: 15.2 g/dL (ref 13.0–17.0)
Immature Granulocytes: 0 %
Lymphocytes Relative: 29 %
Lymphs Abs: 2 10*3/uL (ref 0.7–4.0)
MCH: 28.2 pg (ref 26.0–34.0)
MCHC: 32.2 g/dL (ref 30.0–36.0)
MCV: 87.6 fL (ref 80.0–100.0)
Monocytes Absolute: 0.4 10*3/uL (ref 0.1–1.0)
Monocytes Relative: 6 %
Neutro Abs: 4.2 10*3/uL (ref 1.7–7.7)
Neutrophils Relative %: 62 %
Platelets: 213 10*3/uL (ref 150–400)
RBC: 5.39 MIL/uL (ref 4.22–5.81)
RDW: 13 % (ref 11.5–15.5)
WBC: 6.8 10*3/uL (ref 4.0–10.5)
nRBC: 0 % (ref 0.0–0.2)

## 2019-12-22 LAB — SALICYLATE LEVEL: Salicylate Lvl: <7 mg/dL — ABNORMAL LOW (ref 7.0–30.0)

## 2019-12-22 LAB — COMPREHENSIVE METABOLIC PANEL
ALT: 28 U/L (ref 0–44)
AST: 25 U/L (ref 15–41)
Albumin: 3.9 g/dL (ref 3.5–5.0)
Alkaline Phosphatase: 62 U/L (ref 38–126)
Anion gap: 8 (ref 5–15)
BUN: 14 mg/dL (ref 6–20)
CO2: 27 mmol/L (ref 22–32)
Calcium: 8.4 mg/dL — ABNORMAL LOW (ref 8.9–10.3)
Chloride: 102 mmol/L (ref 98–111)
Creatinine, Ser: 0.96 mg/dL (ref 0.61–1.24)
GFR calc Af Amer: >60 mL/min (ref >60)
GFR calc non Af Amer: >60 mL/min (ref >60)
Glucose, Bld: 104 mg/dL — ABNORMAL HIGH (ref 70–99)
Potassium: 3.4 mmol/L — ABNORMAL LOW (ref 3.5–5.1)
Sodium: 137 mmol/L (ref 135–145)
Total Bilirubin: 0.6 mg/dL (ref 0.3–1.2)
Total Protein: 7.2 g/dL (ref 6.5–8.1)

## 2019-12-22 LAB — ACETAMINOPHEN LEVEL: Acetaminophen (Tylenol), Serum: <10 ug/mL — ABNORMAL LOW (ref 10–30)

## 2019-12-22 MED ORDER — LIDOCAINE 5 % EX PTCH
1.0000 | MEDICATED_PATCH | Freq: Once | CUTANEOUS | Status: DC
  Administered 2019-12-22: 1 via TRANSDERMAL
  Filled 2019-12-22: qty 1

## 2019-12-22 MED ORDER — METHOCARBAMOL 1000 MG/10ML IJ SOLN
1000.0000 mg | Freq: Once | INTRAMUSCULAR | Status: AC
  Administered 2019-12-22: 1000 mg via INTRAMUSCULAR
  Filled 2019-12-22: qty 10

## 2019-12-22 MED ORDER — LIDOCAINE 5 % EX PTCH: 1.0000 | MEDICATED_PATCH | CUTANEOUS | 0 refills | Status: AC

## 2019-12-22 MED ORDER — HYDROCODONE-ACETAMINOPHEN 5-325 MG PO TABS
1.0000 | ORAL_TABLET | Freq: Once | ORAL | Status: AC
  Administered 2019-12-22: 1 via ORAL
  Filled 2019-12-22: qty 1

## 2019-12-22 MED ORDER — METHOCARBAMOL 500 MG PO TABS: 500.0000 mg | ORAL_TABLET | Freq: Two times a day (BID) | ORAL | 0 refills | Status: DC

## 2019-12-22 MED ORDER — SODIUM CHLORIDE 0.9 % IV BOLUS
1000.0000 mL | Freq: Once | INTRAVENOUS | Status: AC
  Administered 2019-12-22: 1000 mL via INTRAVENOUS

## 2019-12-22 MED ORDER — HYDROCODONE-ACETAMINOPHEN 5-325 MG PO TABS: 1.0000 | ORAL_TABLET | Freq: Four times a day (QID) | ORAL | 0 refills | Status: DC | PRN

## 2019-12-22 NOTE — ED Triage Notes (Signed)
Patient presents with back pain radiating in to the leg. He has had a history of sciatica and bulging disc. Patient was able to ambulate with great assistance from EMS. The patient took ibuprofen with flexeril this am with no affect.      EMS vitals: 130 palpated BP 80 HR 16 Resp Rate 95% O2 sat on room air

## 2019-12-22 NOTE — Discharge Instructions (Signed)
Call [facility] tomorrow to schedule an appointment as soon as possible Discontinue using Ibuprofen - you have been using too much Ibuprofen and can cause irreversible kidney damage if you continue taking it the way you have been. You can take 600-800 mg Ibuprofen every 6-8 hours as needed for pain. (DO NOT EXCEED 3000 MG PER DAY)  Please pick up medications and take as prescribed. DO NOT DRIVE WHILE ON THE MUSCLE RELAXER AS IT CAN MAKE YOU DROWSY.  Return to the ED for any worsening symptoms including worsening pain, inability to feel your groin, inability to walk, holding onto urine, peeing or pooping onto yourself, fevers > 100.4, or any other concerning symptoms

## 2019-12-22 NOTE — ED Provider Notes (Signed)
[facility]-EMERGENCY DEPT Provider Note   CSN: [id number] Arrival date & time: 12/22/19  1323     History Chief Complaint  Patient presents with  . Back Pain  . Leg Pain    Reginald Black is a 39 y.o. male who presents to the ED today via EMS with complaint of gradual onset, constant, worsening, 10/10, left lower back pain radiating down LLE x 3-4 weeks. Pt reports he has a hx of sciatica and bulging disc. Per chart review he was seen in the ED on 02/23 for same complaint. He was treated with toradol in the ED and discharged home with steroid taper pack. He reports the steroids did not relieve his symptoms at all. Per chart review pt was given resource list for help finding a PCP as well as recommendations for outpatient follow up with neurosurgery. Pt has not followed up with either. He reports he has been taking Ibuprofen and his mother's flexeril without relief. When asked how much Ibuprofen pt responds "1600 mg every 6-8 hours." He states he has been taking #2 800 mg Ibuprofen every 6 hours for the past 3 days. He denies any abdominal pain, nausea, vomiting. He denies fevers, chills, saddle anesthesia, urinary or bowel incontinence, urinary retention, or any other associated symptoms. No hx of IVDA.   The history is provided by the patient and medical records.       Past Medical History:  Diagnosis Date  . Diverticulitis     There are no problems to display for this patient.   Past Surgical History:  Procedure Laterality Date  . FOOT SURGERY    . KNEE ARTHROPLASTY    . WRIST SURGERY         History reviewed. No pertinent family history.  Social History   Tobacco Use  . Smoking status: Current Every Day Smoker    Packs/day: 0.50    Types: Cigarettes  . Smokeless tobacco: Former Engineer, water Use Topics  . Alcohol use: No  . Drug use: No    Home Medications Prior to Admission medications   Medication Sig Start Date End Date Taking?  Authorizing Provider  albuterol (PROVENTIL HFA;VENTOLIN HFA) 108 (90 Base) MCG/ACT inhaler Inhale 2 puffs into the lungs every 4 (four) hours as needed for wheezing or shortness of breath. 10/22/15   Gilda Crease, MD  HYDROcodone-acetaminophen (NORCO/VICODIN) 5-325 MG tablet Take 1 tablet by mouth every 6 (six) hours as needed for severe pain. 12/22/19   Hyman Hopes, , PA-C  ibuprofen (ADVIL,MOTRIN) 200 MG tablet Take 1,600 mg by mouth every 6 (six) hours as needed for moderate pain.    [provider]  lidocaine (LIDODERM) 5 % Place 1 patch onto the skin daily. Remove & Discard patch within 12 hours or as directed by MD 12/22/19   Tanda Rockers, PA-C  methocarbamol (ROBAXIN) 500 MG tablet Take 1 tablet (500 mg total) by mouth 2 (two) times daily. 12/22/19   Tanda Rockers, PA-C  Multiple Vitamin (THERA) TABS Take 1 tablet by mouth daily.    [provider]  niacin 500 MG tablet Take 1 tablet by mouth daily.    [provider]  predniSONE (STERAPRED UNI-PAK 21 TAB) 10 MG (21) TBPK tablet Take by mouth daily. Take 6 tabs by mouth daily  for 2 days, then 5 tabs for 2 days, then 4 tabs for 2 days, then 3 tabs for 2 days, 2 tabs for 2 days, then 1 tab by mouth daily  for 2 days 11/25/19   Albrizze, Yvonna Alanis E, PA-C  vitamin B-12 (CYANOCOBALAMIN) 1000 MCG tablet Take 1 tablet by mouth daily.    [provider]  fluticasone (FLONASE) 50 MCG/ACT nasal spray Place 2 sprays into both nostrils daily. 10/23/17 11/25/19  Loren Racer, MD  loratadine (CLARITIN) 10 MG tablet Take 1 tablet (10 mg total) by mouth daily. 10/23/17 11/25/19  Loren Racer, MD    Allergies    Codeine  Review of Systems   Review of Systems  Constitutional: Negative for chills and fever.  Gastrointestinal: Negative for abdominal pain, nausea and vomiting.  Genitourinary: Negative for difficulty urinating.  Musculoskeletal: Positive for back pain.  All other systems reviewed and are  negative.   Physical Exam Updated Vital Signs BP (!) 148/104   Pulse 78   Temp 98.7 F (37.1 C) (Oral)   Resp (!) 22   Ht 6\' 1"  (1.854 m)   Wt 129.3 kg   SpO2 99%   BMI 37.60 kg/m   Physical Exam Vitals and nursing note reviewed.  Constitutional:      Appearance: He is obese. He is not ill-appearing or diaphoretic.  HENT:     Head: Normocephalic and atraumatic.  Eyes:     Conjunctiva/sclera: Conjunctivae normal.  Cardiovascular:     Rate and Rhythm: Normal rate and regular rhythm.     Pulses: Normal pulses.  Pulmonary:     Effort: Pulmonary effort is normal.     Breath sounds: Normal breath sounds. No wheezing, rhonchi or rales.  Abdominal:     Palpations: Abdomen is soft.     Tenderness: There is no abdominal tenderness. There is no guarding or rebound.  Musculoskeletal:     Cervical back: Neck supple.       Back:     Comments: No C, T, or L midline spinal TTP. + left lumbar paraspinal TTP. + SLR on left. Negative SLR on right. Strength and sensation intact to BLEs. 2+ DP and PT pulses.   Skin:    General: Skin is warm and dry.  Neurological:     Mental Status: He is alert.     ED Results / Procedures / Treatments   Labs (all labs ordered are listed, but only abnormal results are displayed) Labs Reviewed  COMPREHENSIVE METABOLIC PANEL - Abnormal; Notable for the following components:      Result Value   Potassium 3.4 (*)    Glucose, Bld 104 (*)    Calcium 8.4 (*)    All other components within normal limits  SALICYLATE LEVEL - Abnormal; Notable for the following components:   Salicylate Lvl <7.0 (*)    All other components within normal limits  ACETAMINOPHEN LEVEL - Abnormal; Notable for the following components:   Acetaminophen (Tylenol), Serum <10 (*)    All other components within normal limits  CBC WITH DIFFERENTIAL/PLATELET    EKG EKG Interpretation  Date/Time:  Monday December 22 2019 14:14:21 EDT Ventricular Rate:  66 PR Interval:    QRS  Duration: 94 QT Interval:  410 QTC Calculation: 430 R Axis:   56 Text Interpretation: Sinus rhythm ST elev, probable normal early repol pattern No significant change since 1/19 Confirmed by 2/19 [phone]) on 12/22/2019 2:22:49 PM   Radiology No results found.  Procedures Procedures (including critical care time)  Medications Ordered in ED Medications  lidocaine (LIDODERM) 5 % 1 patch (1 patch Transdermal Patch Applied 12/22/19 1628)  sodium chloride 0.9 % bolus 1,000 mL (1,000 mLs  Intravenous New Bag/Given 12/22/19 1441)  HYDROcodone-acetaminophen (NORCO/VICODIN) 5-325 MG per tablet 1 tablet (1 tablet Oral Given 12/22/19 1628)  methocarbamol (ROBAXIN) injection 1,000 mg (1,000 mg Intramuscular Given 12/22/19 1634)    ED Course  I have reviewed the triage vital signs and the nursing notes.  Pertinent labs & imaging results that were available during my care of the patient were reviewed by me and considered in my medical decision making (see chart for details).       MDM Rules/Calculators/A&P                      39 year old male who presents to the ED today with low back pain radiating down his left leg for the past 3 to 4 weeks.  History of sciatica and bulging disc.  Seen in the ED 3 weeks ago for same.  Treated with prednisone without relief.  On arrival to the ED patient is afebrile, nontachycardic and nontachypneic.  He appears to be uncomfortable due to pain.  He does admit that he has been taking 600 mg ibuprofen every 6 hours for the past 3 days.  Given this we will consult poison control.  Will hold off on pain medication at this time to ensure patient does not have any worsening renal function.  Without any red flags concerning for cauda equina, spinal epidural abscess, AAA.  Discussed case with Poison Control who recommends CMP to ensure no renal impairment or metabolic acidosis. They also recommend IVFs at this time. Will continue following patient.  CMP with  potassium 3.4, glucose 104, calcium 8.4.  Creatinine 0.96.  GFR greater than 60.  No gap.  Remainder of labs reassuring. Pt has received fluids per poison control.   Have given Norco, Robaxin, and Lidocaine patch in the ED with mild improvement in his symptoms.  Does appear that patient was advised to follow-up with neurosurgery during last ED visit but never did so.  He states he lost the paperwork with contact information.  Strongly encouraged that he call them tomorrow for follow-up.  I lengthy discussion with patient regarding the fact that he needs to discontinue using ibuprofen the way he is doing as he can damage his kidneys significantly.  He is in agreement with plan.  In amount of pain patient has been will prescribe a very short course of Norco to take as needed.  Will prescribe Robaxin as well as lidocaine patches.  PDMP reviewed, patient without any suspicious activity.   This note was prepared using Dragon voice recognition software and may include unintentional dictation errors due to the inherent limitations of voice recognition software.   Final Clinical Impression(s) / ED Diagnoses Final diagnoses:  Acute left-sided low back pain with left-sided sciatica    Rx / DC Orders ED Discharge Orders         Ordered    HYDROcodone-acetaminophen (NORCO/VICODIN) 5-325 MG tablet  Every 6 hours PRN     12/22/19 1710    methocarbamol (ROBAXIN) 500 MG tablet  2 times daily     12/22/19 1710    lidocaine (LIDODERM) 5 %  Every 24 hours     12/22/19 1711           Discharge Instructions     Call [facility] tomorrow to schedule an appointment as soon as possible Discontinue using Ibuprofen - you have been using too much Ibuprofen and can cause irreversible kidney damage if you continue taking it the way you  have been. You can take 600-800 mg Ibuprofen every 6-8 hours as needed for pain. (DO NOT EXCEED 3000 MG PER DAY)  Please pick up medications and take as  prescribed. DO NOT DRIVE WHILE ON THE MUSCLE RELAXER AS IT CAN MAKE YOU DROWSY.  Return to the ED for any worsening symptoms including worsening pain, inability to feel your groin, inability to walk, holding onto urine, peeing or pooping onto yourself, fevers > 100.4, or any other concerning symptoms       Tanda Rockers, PA-C 12/22/19 1714    Terrilee Files, MD 12/22/19 [phone]

## 2019-12-26 ENCOUNTER — Other Ambulatory Visit: Payer: Self-pay | Admitting: Neurosurgery

## 2019-12-26 DIAGNOSIS — M5416 Radiculopathy, lumbar region: Secondary | ICD-10-CM

## 2020-01-11 ENCOUNTER — Ambulatory Visit
Admission: RE | Admit: 2020-01-11 | Discharge: 2020-01-11 | Disposition: A | Discharge status: Home / Self Care | Payer: No Typology Code available for payment source | Source: Ambulatory Visit | Attending: Neurosurgery | Admitting: Neurosurgery

## 2020-01-11 ENCOUNTER — Other Ambulatory Visit: Payer: Self-pay

## 2020-01-11 DIAGNOSIS — M5416 Radiculopathy, lumbar region: Secondary | ICD-10-CM

## 2020-05-01 ENCOUNTER — Encounter (HOSPITAL_COMMUNITY): Payer: Self-pay | Admitting: Emergency Medicine

## 2020-05-01 ENCOUNTER — Other Ambulatory Visit: Payer: Self-pay

## 2020-05-01 ENCOUNTER — Emergency Department (HOSPITAL_COMMUNITY)
Admission: EM | Admit: 2020-05-01 | Discharge: 2020-05-01 | Disposition: A | Discharge status: Home / Self Care | Payer: Self-pay | Attending: Emergency Medicine | Admitting: Emergency Medicine

## 2020-05-01 DIAGNOSIS — R220 Localized swelling, mass and lump, head: Secondary | ICD-10-CM | POA: Insufficient documentation

## 2020-05-01 DIAGNOSIS — K0889 Other specified disorders of teeth and supporting structures: Secondary | ICD-10-CM | POA: Insufficient documentation

## 2020-05-01 DIAGNOSIS — E669 Obesity, unspecified: Secondary | ICD-10-CM | POA: Insufficient documentation

## 2020-05-01 DIAGNOSIS — F17210 Nicotine dependence, cigarettes, uncomplicated: Secondary | ICD-10-CM | POA: Insufficient documentation

## 2020-05-01 MED ORDER — AZITHROMYCIN 250 MG PO TABS: 250.0000 mg | ORAL_TABLET | Freq: Every day | ORAL | 0 refills | Status: DC

## 2020-05-01 MED ORDER — AMOXICILLIN-POT CLAVULANATE 875-125 MG PO TABS: 1.0000 | ORAL_TABLET | Freq: Two times a day (BID) | ORAL | 0 refills | Status: DC

## 2020-05-01 MED ORDER — AZITHROMYCIN 250 MG PO TABS
250.0000 mg | ORAL_TABLET | Freq: Every day | ORAL | 0 refills | Status: DC
Start: 2020-05-01 — End: 2022-03-28

## 2020-05-01 MED ORDER — IBUPROFEN 400 MG PO TABS
600.0000 mg | ORAL_TABLET | Freq: Once | ORAL | Status: AC
  Administered 2020-05-01: 600 mg via ORAL
  Filled 2020-05-01: qty 1

## 2020-05-01 NOTE — ED Provider Notes (Signed)
[facility] EMERGENCY DEPARTMENT Provider Note   CSN: [id number] Arrival date & time: 05/01/20  1001     History Chief Complaint  Patient presents with  . Facial Swelling  . Dental Problem    Reginald Black is a 39 y.o. male who presents with facial swelling and dental pain. He states that he started having some dental pain last night and then this morning woke up with facial swelling over the R cheek. It has progressively worsened throughout the day and now under his eye is swollen too. He denies fevers. He has waited about 9 hours prior to being seen and states that the swelling has been getting worse. He denies fevers or difficulty swallowing.       Past Medical History:  Diagnosis Date  . Diverticulitis     There are no problems to display for this patient.   Past Surgical History:  Procedure Laterality Date  . FOOT SURGERY    . KNEE ARTHROPLASTY    . WRIST SURGERY         No family history on file.  Social History   Tobacco Use  . Smoking status: Current Every Day Smoker    Packs/day: 0.50    Types: Cigarettes  . Smokeless tobacco: Former Engineer, water Use Topics  . Alcohol use: No  . Drug use: No    Home Medications Prior to Admission medications   Medication Sig Start Date End Date Taking? Authorizing Provider  albuterol (PROVENTIL HFA;VENTOLIN HFA) 108 (90 Base) MCG/ACT inhaler Inhale 2 puffs into the lungs every 4 (four) hours as needed for wheezing or shortness of breath. 10/22/15   Gilda Crease, MD  amoxicillin-clavulanate (AUGMENTIN) 875-125 MG tablet Take 1 tablet by mouth every 12 (twelve) hours. 05/01/20   Bethel Born, PA-C  azithromycin (ZITHROMAX) 250 MG tablet Take 1 tablet (250 mg total) by mouth daily. Take first 2 tablets together, then 1 every day until finished. 05/01/20   Bethel Born, PA-C  HYDROcodone-acetaminophen (NORCO/VICODIN) 5-325 MG tablet Take 1 tablet by mouth every 6 (six) hours as  needed for severe pain. 12/22/19   Hyman Hopes, Margaux, PA-C  ibuprofen (ADVIL,MOTRIN) 200 MG tablet Take 1,600 mg by mouth every 6 (six) hours as needed for moderate pain.    [provider]  lidocaine (LIDODERM) 5 % Place 1 patch onto the skin daily. Remove & Discard patch within 12 hours or as directed by MD 12/22/19   Tanda Rockers, PA-C  methocarbamol (ROBAXIN) 500 MG tablet Take 1 tablet (500 mg total) by mouth 2 (two) times daily. 12/22/19   Tanda Rockers, PA-C  Multiple Vitamin (THERA) TABS Take 1 tablet by mouth daily.    [provider]  niacin 500 MG tablet Take 1 tablet by mouth daily.    [provider]  predniSONE (STERAPRED UNI-PAK 21 TAB) 10 MG (21) TBPK tablet Take by mouth daily. Take 6 tabs by mouth daily  for 2 days, then 5 tabs for 2 days, then 4 tabs for 2 days, then 3 tabs for 2 days, 2 tabs for 2 days, then 1 tab by mouth daily for 2 days 11/25/19   Albrizze, Kaitlyn E, PA-C  vitamin B-12 (CYANOCOBALAMIN) 1000 MCG tablet Take 1 tablet by mouth daily.    [provider]  fluticasone (FLONASE) 50 MCG/ACT nasal spray Place 2 sprays into both nostrils daily. 10/23/17 11/25/19  Loren Racer, MD  loratadine (CLARITIN) 10 MG tablet Take 1 tablet (10 mg  total) by mouth daily. 10/23/17 11/25/19  Loren Racer, MD    Allergies    Codeine  Review of Systems   Review of Systems  Constitutional: Negative for fever.  HENT: Positive for dental problem and sinus pain.     Physical Exam Updated Vital Signs BP (!) 147/103 (BP Location: Right Arm)   Pulse 81   Temp 98.1 F (36.7 C) (Oral)   Resp 18   SpO2 96%   Physical Exam Vitals and nursing note reviewed.  Constitutional:      General: He is not in acute distress.    Appearance: He is well-developed. He is obese. He is not ill-appearing.  HENT:     Head: Normocephalic and atraumatic.     Comments: Right maxillary tenderness and swelling    Mouth/Throat:     Comments: Widespread poor  dentition. No dental abscess Eyes:     General: No scleral icterus.       Right eye: No discharge.        Left eye: No discharge.     Conjunctiva/sclera: Conjunctivae normal.     Pupils: Pupils are equal, round, and reactive to light.     Comments: Periorbital swelling under the left eye  Cardiovascular:     Rate and Rhythm: Normal rate and regular rhythm.  Pulmonary:     Effort: Pulmonary effort is normal. No respiratory distress.     Breath sounds: Normal breath sounds.  Abdominal:     General: There is no distension.  Musculoskeletal:     Cervical back: Normal range of motion.  Skin:    General: Skin is warm and dry.  Neurological:     Mental Status: He is alert and oriented to person, place, and time.  Psychiatric:        Behavior: Behavior normal.     ED Results / Procedures / Treatments   Labs (all labs ordered are listed, but only abnormal results are displayed) Labs Reviewed - No data to display  EKG None  Radiology No results found.  Procedures Procedures (including critical care time)  Medications Ordered in ED Medications  ibuprofen (ADVIL) tablet 600 mg (600 mg Oral Given 05/01/20 1950)    ED Course  I have reviewed the triage vital signs and the nursing notes.  Pertinent labs & imaging results that were available during my care of the patient were reviewed by me and considered in my medical decision making (see chart for details).  39 year old male presents with R sided facial/cheek swelling. Pt thinks it may be dental source. Patient is afebrile, non toxic appearing, and swallowing secretions well.  No obvious drainable abscess and doubt deep space head or neck infection. Will tx with antibiotics and NSAIDs. I gave patient referral to dentist and stressed the importance of dental follow up for ultimate management of dental pain. Discussed findings, treatment, and follow up  with patient.  Pt given return precautions.  Pt verbalizes understanding and  agrees with plan.    MDM Rules/Calculators/A&P                           Final Clinical Impression(s) / ED Diagnoses Final diagnoses:  Pain, dental  Facial swelling    Rx / DC Orders ED Discharge Orders         Ordered    amoxicillin-clavulanate (AUGMENTIN) 875-125 MG tablet  Every 12 hours,   Status:  Discontinued     Reprint  05/01/20 1927    azithromycin (ZITHROMAX) 250 MG tablet  Daily,   Status:  Discontinued     Reprint     05/01/20 1927    amoxicillin-clavulanate (AUGMENTIN) 875-125 MG tablet  Every 12 hours     Discontinue  Reprint     05/01/20 1956    azithromycin (ZITHROMAX) 250 MG tablet  Daily     Discontinue  Reprint     05/01/20 1956           Bethel Born, PA-C 05/02/20 1502    Mancel Bale, MD 05/02/20 [phone]

## 2020-05-01 NOTE — Discharge Instructions (Signed)
Take Augmentin twice daily for one week for dental infection vs possible sinus infection Take Ibuprofen or Tylenol as needed for pain Please follow up with a dentist

## 2020-05-01 NOTE — ED Notes (Signed)
Discharged before I saw him

## 2020-05-01 NOTE — ED Triage Notes (Signed)
Pt. Stated, I woke up and my right side of face was swollen I think cause Of my teeth.

## 2020-07-08 ENCOUNTER — Other Ambulatory Visit: Payer: Self-pay | Admitting: Pain Medicine

## 2020-07-08 DIAGNOSIS — M5416 Radiculopathy, lumbar region: Secondary | ICD-10-CM

## 2020-07-16 ENCOUNTER — Ambulatory Visit
Admission: RE | Admit: 2020-07-16 | Discharge: 2020-07-16 | Disposition: A | Discharge status: Home / Self Care | Payer: No Typology Code available for payment source | Source: Ambulatory Visit | Attending: Pain Medicine | Admitting: Pain Medicine

## 2020-07-16 ENCOUNTER — Other Ambulatory Visit: Payer: Self-pay

## 2020-07-16 DIAGNOSIS — M5416 Radiculopathy, lumbar region: Secondary | ICD-10-CM

## 2020-07-16 MED ORDER — IOPAMIDOL (ISOVUE-M 200) INJECTION 41%
1.0000 mL | Freq: Once | INTRAMUSCULAR | Status: AC
  Administered 2020-07-16: 1 mL via EPIDURAL

## 2020-07-16 MED ORDER — METHYLPREDNISOLONE ACETATE 40 MG/ML INJ SUSP (RADIOLOG
120.0000 mg | Freq: Once | INTRAMUSCULAR | Status: AC
  Administered 2020-07-16: 120 mg via EPIDURAL

## 2020-07-16 NOTE — Discharge Instructions (Signed)

## 2020-09-13 ENCOUNTER — Emergency Department (HOSPITAL_COMMUNITY)
Admission: EM | Admit: 2020-09-13 | Discharge: 2020-09-13 | Disposition: A | Discharge status: Home / Self Care | Payer: No Typology Code available for payment source | Attending: Emergency Medicine | Admitting: Emergency Medicine

## 2020-09-13 ENCOUNTER — Other Ambulatory Visit: Payer: Self-pay

## 2020-09-13 ENCOUNTER — Emergency Department (HOSPITAL_COMMUNITY): Payer: No Typology Code available for payment source

## 2020-09-13 DIAGNOSIS — M25551 Pain in right hip: Secondary | ICD-10-CM | POA: Insufficient documentation

## 2020-09-13 DIAGNOSIS — Z96659 Presence of unspecified artificial knee joint: Secondary | ICD-10-CM | POA: Insufficient documentation

## 2020-09-13 DIAGNOSIS — F17210 Nicotine dependence, cigarettes, uncomplicated: Secondary | ICD-10-CM | POA: Insufficient documentation

## 2020-09-13 DIAGNOSIS — X58XXXA Exposure to other specified factors, initial encounter: Secondary | ICD-10-CM | POA: Insufficient documentation

## 2020-09-13 DIAGNOSIS — M79672 Pain in left foot: Secondary | ICD-10-CM | POA: Insufficient documentation

## 2020-09-13 DIAGNOSIS — S0502XA Injury of conjunctiva and corneal abrasion without foreign body, left eye, initial encounter: Secondary | ICD-10-CM | POA: Insufficient documentation

## 2020-09-13 MED ORDER — TETRACAINE HCL 0.5 % OP SOLN
1.0000 [drp] | Freq: Once | OPHTHALMIC | Status: AC
  Administered 2020-09-13: 11:00:00 1 [drp] via OPHTHALMIC
  Filled 2020-09-13: qty 4

## 2020-09-13 MED ORDER — ACETAMINOPHEN 500 MG PO TABS
1000.0000 mg | ORAL_TABLET | Freq: Once | ORAL | Status: AC
  Administered 2020-09-13: 11:00:00 1000 mg via ORAL
  Filled 2020-09-13: qty 2

## 2020-09-13 MED ORDER — ERYTHROMYCIN 5 MG/GM OP OINT
TOPICAL_OINTMENT | OPHTHALMIC | 0 refills | Status: DC
Start: 2020-09-13 — End: 2022-03-28

## 2020-09-13 MED ORDER — FLUORESCEIN SODIUM 1 MG OP STRP
1.0000 | ORAL_STRIP | Freq: Once | OPHTHALMIC | Status: AC
  Administered 2020-09-13: 11:00:00 1 via OPHTHALMIC
  Filled 2020-09-13: qty 1

## 2020-09-13 MED ORDER — ERYTHROMYCIN 5 MG/GM OP OINT
1.0000 "application " | TOPICAL_OINTMENT | Freq: Once | OPHTHALMIC | Status: AC
  Administered 2020-09-13: 1 via OPHTHALMIC
  Filled 2020-09-13: qty 3.5

## 2020-09-13 NOTE — ED Triage Notes (Signed)
Pt reports waking up with L eye irritation and redness this morning. Also has sharp pain in R foot x 1 month. Hx of surgery on that foot as a child.

## 2020-09-13 NOTE — Discharge Instructions (Addendum)
You can take 600 mg of ibuprofen every 6 hours, you can take 1000 mg of Tylenol every 6 hours, you can alternate these every 3 or you can take them together. For chronic pain  [facility] in [place] address [address] [place], [place] [address] Phone #[phone]

## 2020-09-13 NOTE — ED Provider Notes (Signed)
[facility] EMERGENCY DEPARTMENT Provider Note   CSN: [id number] Arrival date & time: 09/13/20  0857     History Chief Complaint  Patient presents with  . Eye Pain  . Foot Pain    Prince RomeRobert A Pung is a 39 y.o. male.   Eye Pain This is a new problem. The current episode started 6 to 12 hours ago. The problem occurs constantly. The problem has not changed since onset.Pertinent negatives include no chest pain, no headaches and no shortness of breath. Nothing aggravates the symptoms. Nothing relieves the symptoms. He has tried nothing for the symptoms.  Foot Pain Pertinent negatives include no chest pain, no headaches and no shortness of breath.       Past Medical History:  Diagnosis Date  . Diverticulitis     There are no problems to display for this patient.   Past Surgical History:  Procedure Laterality Date  . FOOT SURGERY    . KNEE ARTHROPLASTY    . WRIST SURGERY         No family history on file.  Social History   Tobacco Use  . Smoking status: Current Every Day Smoker    Packs/day: 0.50    Types: Cigarettes  . Smokeless tobacco: Former Engineer, waterUser  Substance Use Topics  . Alcohol use: No  . Drug use: No    Home Medications Prior to Admission medications   Medication Sig Start Date End Date Taking? Authorizing Provider  albuterol (PROVENTIL HFA;VENTOLIN HFA) 108 (90 Base) MCG/ACT inhaler Inhale 2 puffs into the lungs every 4 (four) hours as needed for wheezing or shortness of breath. 10/22/15   Gilda CreasePollina, Christopher J, MD  amoxicillin-clavulanate (AUGMENTIN) 875-125 MG tablet Take 1 tablet by mouth every 12 (twelve) hours. 05/01/20   Bethel BornGekas, Kelly Marie, PA-C  azithromycin (ZITHROMAX) 250 MG tablet Take 1 tablet (250 mg total) by mouth daily. Take first 2 tablets together, then 1 every day until finished. 05/01/20   Bethel BornGekas, Kelly Marie, PA-C  erythromycin ophthalmic ointment Place a 1/2 inch ribbon of ointment into the lower eyelid.  Do this 4  times daily for the next 5 days 09/13/20   Sabino DonovanKatz,  C, MD  HYDROcodone-acetaminophen (NORCO/VICODIN) 5-325 MG tablet Take 1 tablet by mouth every 6 (six) hours as needed for severe pain. 12/22/19   Hyman HopesVenter, Margaux, PA-C  ibuprofen (ADVIL,MOTRIN) 200 MG tablet Take 1,600 mg by mouth every 6 (six) hours as needed for moderate pain.    [provider]  lidocaine (LIDODERM) 5 % Place 1 patch onto the skin daily. Remove & Discard patch within 12 hours or as directed by MD 12/22/19   Tanda RockersVenter, Margaux, PA-C  methocarbamol (ROBAXIN) 500 MG tablet Take 1 tablet (500 mg total) by mouth 2 (two) times daily. 12/22/19   Tanda RockersVenter, Margaux, PA-C  Multiple Vitamin (THERA) TABS Take 1 tablet by mouth daily.    [provider]  niacin 500 MG tablet Take 1 tablet by mouth daily.    [provider]  predniSONE (STERAPRED UNI-PAK 21 TAB) 10 MG (21) TBPK tablet Take by mouth daily. Take 6 tabs by mouth daily  for 2 days, then 5 tabs for 2 days, then 4 tabs for 2 days, then 3 tabs for 2 days, 2 tabs for 2 days, then 1 tab by mouth daily for 2 days 11/25/19   Namon CirriWalisiewicz, Kaitlyn E, PA-C  vitamin B-12 (CYANOCOBALAMIN) 1000 MCG tablet Take 1 tablet by mouth daily.    [provider]  fluticasone (FLONASE) 50 MCG/ACT nasal spray Place 2 sprays into both nostrils daily. 10/23/17 11/25/19  Loren Racer, MD  loratadine (CLARITIN) 10 MG tablet Take 1 tablet (10 mg total) by mouth daily. 10/23/17 11/25/19  Loren Racer, MD    Allergies    Codeine  Review of Systems   Review of Systems  Constitutional: Negative for chills and fever.  HENT: Negative for congestion and rhinorrhea.   Eyes: Positive for photophobia, pain, redness and visual disturbance.  Respiratory: Negative for cough and shortness of breath.   Cardiovascular: Negative for chest pain and palpitations.  Gastrointestinal: Negative for diarrhea, nausea and vomiting.  Genitourinary: Negative for difficulty urinating and dysuria.   Musculoskeletal: Negative for arthralgias and back pain.  Skin: Negative for color change and rash.  Neurological: Negative for light-headedness and headaches.    Physical Exam Updated Vital Signs BP (!) 157/115   Pulse 79   Temp 98.4 F (36.9 C) (Oral)   Resp 14   Ht 6' (1.829 m)   SpO2 99%   BMI 38.65 kg/m   Physical Exam Vitals and nursing note reviewed.  Constitutional:      General: He is not in acute distress.    Appearance: Normal appearance.  HENT:     Head: Normocephalic and atraumatic.     Nose: No rhinorrhea.  Eyes:     General: Lids are normal. Lids are everted, no foreign bodies appreciated.        Right eye: No discharge.        Left eye: No discharge.     Conjunctiva/sclera:     Right eye: Right conjunctiva is not injected. No chemosis, exudate or hemorrhage.    Left eye: Left conjunctiva is injected. No chemosis, exudate or hemorrhage.    Pupils: Pupils are equal, round, and reactive to light.     Left eye: Fluorescein uptake (7 o'clock small area of uptake) present.     Slit lamp exam:    Right eye: No foreign body.     Left eye: No foreign body.  Cardiovascular:     Rate and Rhythm: Normal rate and regular rhythm.  Pulmonary:     Effort: Pulmonary effort is normal.     Breath sounds: No stridor.  Abdominal:     General: Abdomen is flat. There is no distension.     Palpations: Abdomen is soft.  Musculoskeletal:        General: No swelling, tenderness, deformity or signs of injury.     Right lower leg: No edema.     Left lower leg: No edema.     Comments: Surgical scar on the medial surface of the left foot no erythema induration, no deformity intact pulses no open wounds no bony tenderness.  Normal range of motion of the hip no deformity no color change, no focal bony tenderness  Skin:    General: Skin is warm and dry.  Neurological:     General: No focal deficit present.     Mental Status: He is alert. Mental status is at baseline.     Motor:  No weakness.  Psychiatric:        Mood and Affect: Mood normal.        Behavior: Behavior normal.        Thought Content: Thought content normal.     ED Results / Procedures / Treatments   Labs (all labs ordered are listed, but only abnormal results are displayed) Labs Reviewed - No data to display  EKG None  Radiology DG Foot Complete Left  Result Date: 09/13/2020 CLINICAL DATA:  39 year old male with persistent pain at the top of the left foot for 4 weeks. EXAM: LEFT FOOT - COMPLETE 3+ VIEW COMPARISON:  None. FINDINGS: Pes planus. Bone mineralization is within normal limits. Small accessory ossicles along the medial talonavicular articulation. Mild 1st MTP joint space loss and subchondral sclerosis. Other joint spaces appear preserved. Mild degenerative spurring at the acetabulum. No acute osseous abnormality identified. No discrete soft tissue abnormality. IMPRESSION: 1.  No acute osseous abnormality identified. 2. Pes planus.  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 Electronically Signed   By: Odessa Fleming M.D.   On: 09/13/2020 11:13   DG Hip Unilat W or Wo Pelvis 2-3 Views Right  Result Date: 09/13/2020 CLINICAL DATA:  39 year old male with persistent right hip pain with walking. EXAM: DG HIP (WITH OR WITHOUT PELVIS) 2-3V RIGHT COMPARISON:  CT Abdomen and Pelvis 05/15/2013. FINDINGS: Bone mineralization is within normal limits. There is no evidence of hip fracture or dislocation. Hip joint spaces appear symmetric. Mild chronic acetabular spurring is slightly greater on the right. Normal SI joints. Negative visible lower abdominal and pelvic visceral contours. IMPRESSION: No acute osseous abnormality identified. Mild degenerative acetabular spurring, greater on the right. Electronically Signed   By: Odessa Fleming M.D.   On: 09/13/2020 11:12    Procedures Procedures (including critical care time)  Medications Ordered in ED Medications  erythromycin ophthalmic ointment 1 application (has no  administration in time range)  fluorescein ophthalmic strip 1 strip (1 strip Both Eyes Given by Other 09/13/20 1041)  tetracaine (PONTOCAINE) 0.5 % ophthalmic solution 1 drop (1 drop Both Eyes Given by Other 09/13/20 1041)  acetaminophen (TYLENOL) tablet 1,000 mg (1,000 mg Oral Given 09/13/20 1040)    ED Course  I have reviewed the triage vital signs and the nursing notes.  Pertinent labs & imaging results that were available during my care of the patient were reviewed by me and considered in my medical decision making (see chart for details).    MDM Rules/Calculators/A&P                          Patient has no chronic medical care, comes today with multiple complaints is been ill again for months, as well as 12 hours of eye irritation.  Concerned he has a scratch to his eye.  He has pain of his left foot that he has had chronically for months, has had surgery in the past and needs follow-up.  Will get imaging to make sure there is no new stress fracture or malalignment.  He also has pain of the right hip that he feels is secondary to favoring his right side because of his left foot pain.  Will get imaging of this again to make sure there is no abnormality.  Likely chronic pain needs chronic management.  No signs of infection or underlying illness otherwise.  He will need staining and tetracaine to the eye to evaluate for corneal abrasion no foreign body appreciated on exam.  Corneal abrasion found, Romycin ointment given.  Patient told to wear proper eye protection.  Given follow-up for chronic foot and ankle needs, told to take over-the-counter pain medications, told to follow-up for reevaluation of the corneal abrasion.  Prescription given return precautions given  Final Clinical Impression(s) / ED Diagnoses Final diagnoses:  Abrasion of left cornea, initial encounter    Rx / DC Orders ED Discharge  Orders         Ordered    erythromycin ophthalmic ointment        09/13/20 1130            Sabino Donovan, MD 09/13/20 1131

## 2020-10-25 ENCOUNTER — Encounter (HOSPITAL_COMMUNITY): Payer: Self-pay | Admitting: *Deleted

## 2020-10-25 ENCOUNTER — Emergency Department (HOSPITAL_COMMUNITY)
Admission: EM | Admit: 2020-10-25 | Discharge: 2020-10-25 | Disposition: A | Discharge status: Home / Self Care | Payer: No Typology Code available for payment source | Attending: Emergency Medicine | Admitting: Emergency Medicine

## 2020-10-25 ENCOUNTER — Other Ambulatory Visit: Payer: Self-pay

## 2020-10-25 DIAGNOSIS — R519 Headache, unspecified: Secondary | ICD-10-CM | POA: Insufficient documentation

## 2020-10-25 DIAGNOSIS — Z5321 Procedure and treatment not carried out due to patient leaving prior to being seen by health care provider: Secondary | ICD-10-CM | POA: Insufficient documentation

## 2020-10-25 DIAGNOSIS — H538 Other visual disturbances: Secondary | ICD-10-CM | POA: Insufficient documentation

## 2020-10-25 DIAGNOSIS — K0889 Other specified disorders of teeth and supporting structures: Secondary | ICD-10-CM | POA: Insufficient documentation

## 2020-10-25 DIAGNOSIS — I10 Essential (primary) hypertension: Secondary | ICD-10-CM | POA: Insufficient documentation

## 2020-10-25 LAB — COMPREHENSIVE METABOLIC PANEL
ALT: 32 U/L (ref 0–44)
AST: 24 U/L (ref 15–41)
Albumin: 4 g/dL (ref 3.5–5.0)
Alkaline Phosphatase: 64 U/L (ref 38–126)
Anion gap: 10 (ref 5–15)
BUN: 9 mg/dL (ref 6–20)
CO2: 27 mmol/L (ref 22–32)
Calcium: 9.2 mg/dL (ref 8.9–10.3)
Chloride: 102 mmol/L (ref 98–111)
Creatinine, Ser: 1 mg/dL (ref 0.61–1.24)
GFR, Estimated: >60 mL/min (ref >60)
Glucose, Bld: 107 mg/dL — ABNORMAL HIGH (ref 70–99)
Potassium: 3.8 mmol/L (ref 3.5–5.1)
Sodium: 139 mmol/L (ref 135–145)
Total Bilirubin: 0.6 mg/dL (ref 0.3–1.2)
Total Protein: 7.7 g/dL (ref 6.5–8.1)

## 2020-10-25 LAB — CBC WITH DIFFERENTIAL/PLATELET
Abs Immature Granulocytes: 0.03 10*3/uL (ref 0.00–0.07)
Basophils Absolute: 0.1 10*3/uL (ref 0.0–0.1)
Basophils Relative: 1 %
Eosinophils Absolute: 0.1 10*3/uL (ref 0.0–0.5)
Eosinophils Relative: 1 %
HCT: 47.3 % (ref 39.0–52.0)
Hemoglobin: 15.8 g/dL (ref 13.0–17.0)
Immature Granulocytes: 0 %
Lymphocytes Relative: 19 %
Lymphs Abs: 2.1 10*3/uL (ref 0.7–4.0)
MCH: 28.5 pg (ref 26.0–34.0)
MCHC: 33.4 g/dL (ref 30.0–36.0)
MCV: 85.4 fL (ref 80.0–100.0)
Monocytes Absolute: 0.7 10*3/uL (ref 0.1–1.0)
Monocytes Relative: 6 %
Neutro Abs: 8 10*3/uL — ABNORMAL HIGH (ref 1.7–7.7)
Neutrophils Relative %: 73 %
Platelets: 212 10*3/uL (ref 150–400)
RBC: 5.54 MIL/uL (ref 4.22–5.81)
RDW: 13 % (ref 11.5–15.5)
WBC: 10.9 10*3/uL — ABNORMAL HIGH (ref 4.0–10.5)
nRBC: 0 % (ref 0.0–0.2)

## 2020-10-25 NOTE — ED Notes (Signed)
No response when called for vital reassessment

## 2020-10-25 NOTE — ED Notes (Signed)
No response for vital reassessment

## 2020-10-25 NOTE — ED Triage Notes (Signed)
Pt reports recent dental pain on right side, having increase in pain to entire right side of face since Saturday. Pt is hypertensive at triage, denies hx of HTN. Having headache and blurred vision with the HTN. No neuro deficits are noted at triage.

## 2020-10-25 NOTE — ED Notes (Signed)
No response when called for vital reassessment  

## 2021-07-26 IMAGING — XA Imaging study
2 series · 2 of 2 positions shown · non-contrast
Comparison: none

CLINICAL DATA: Lumbosacral spondylosis without myelopathy. Low back
pain radiating down the left leg. Multiple lumbar disc herniations.

[Series 1: ortho standard · 1 of 1 slices shown (1 of 2)]
[im 1/1]
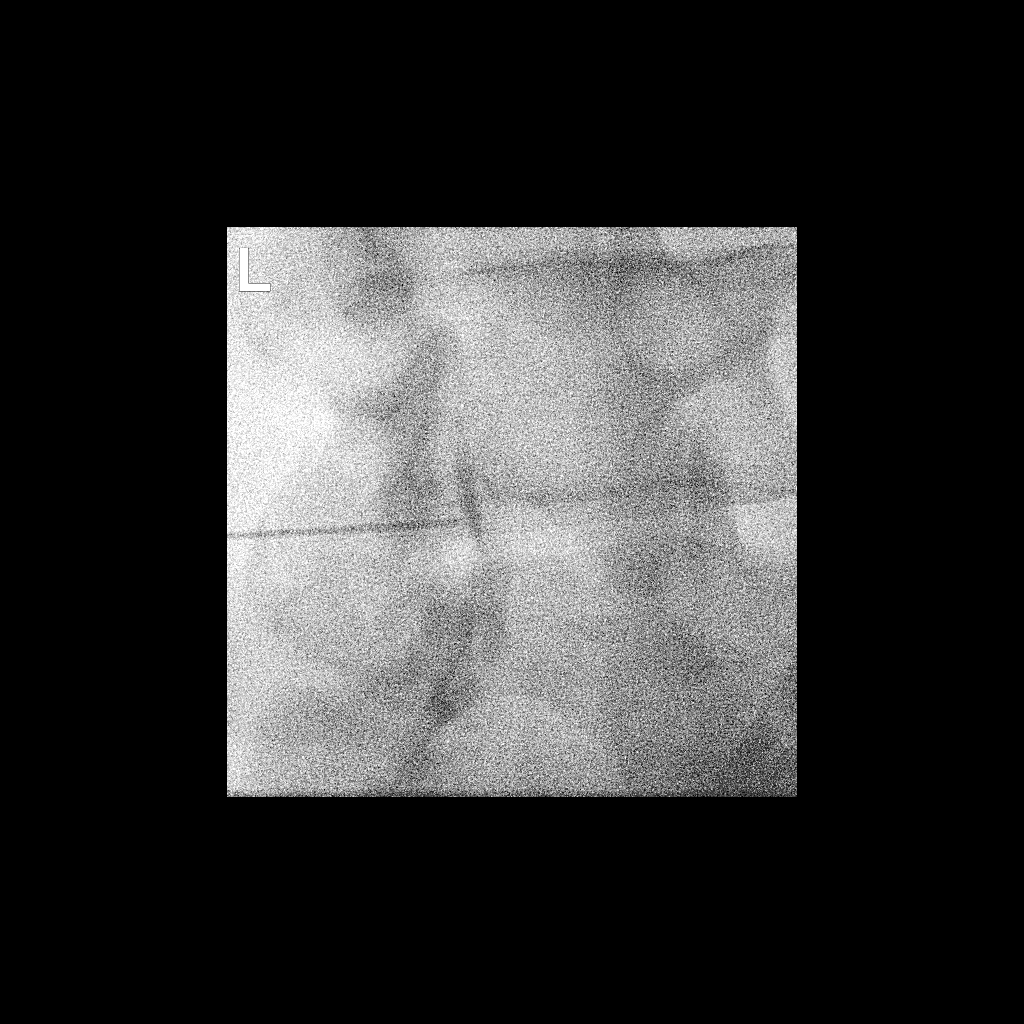

[Series 2: ortho standard · 1 of 1 slices shown (2 of 2)]
[im 1/1]
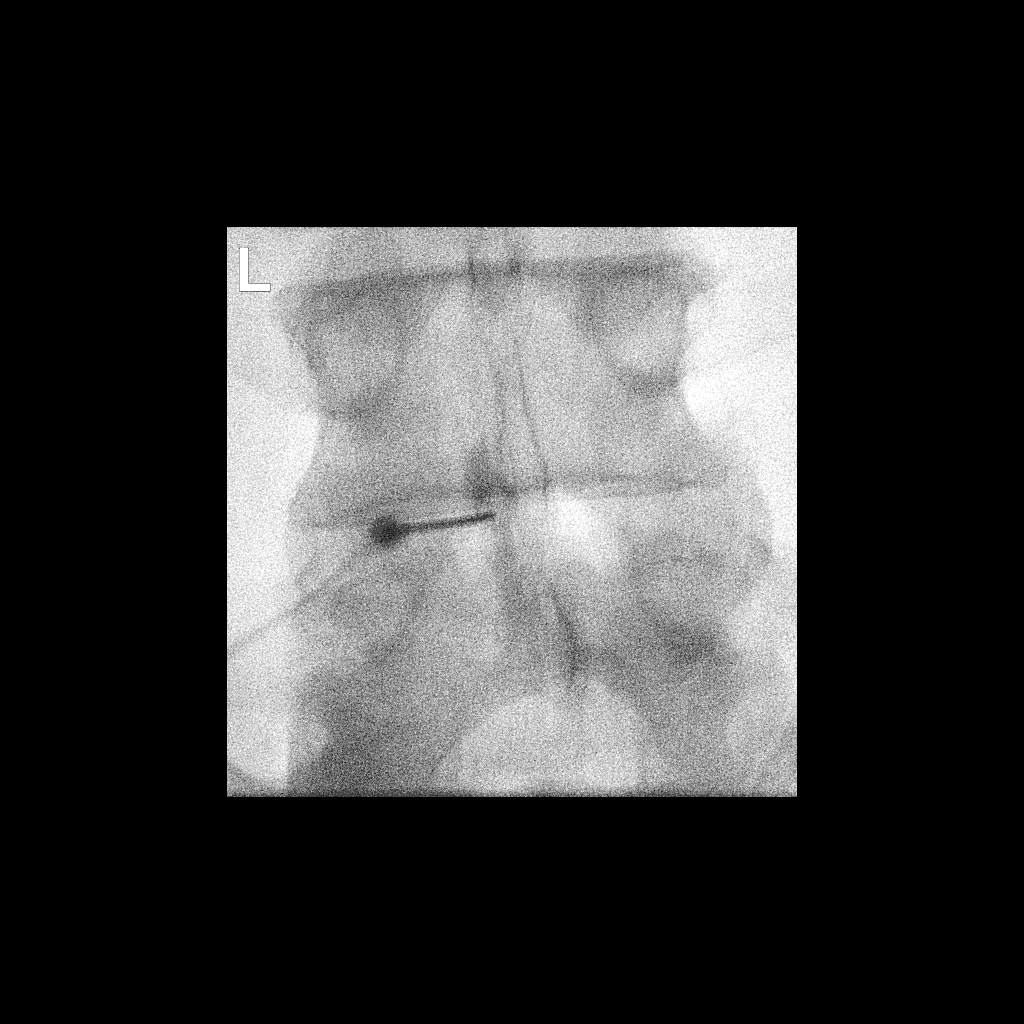

[2 of 2 positions shown; findings below may reference images not displayed]

FLUOROSCOPY TIME:  Fluoroscopy Time: 13 seconds

Radiation Exposure Index: 12.47 microGray*m^2

PROCEDURE:
The procedure, risks, benefits, and alternatives were explained to
the patient. Questions regarding the procedure were encouraged and
answered. The patient understands and consents to the procedure.

LUMBAR EPIDURAL INJECTION:

An interlaminar approach was performed on the left at L4-5. The
overlying skin was cleansed and anesthetized. A 3.5 inch 20 gauge
epidural needle was advanced using loss-of-resistance technique.

DIAGNOSTIC EPIDURAL INJECTION:

Injection of Isovue-M 200 shows a good epidural pattern with spread
above and below the level of needle placement, primarily on the
left. No vascular opacification is seen.

THERAPEUTIC EPIDURAL INJECTION:

120 mg of Depo-Medrol mixed with 3 mL of 1% lidocaine were
instilled. The procedure was well-tolerated, and the patient was
discharged thirty minutes following the injection in good condition.

COMPLICATIONS:
None
IMPRESSION: Technically successful interlaminar epidural injection on the left
at L4-5.

## 2022-03-28 ENCOUNTER — Ambulatory Visit
Admission: EM | Admit: 2022-03-28 | Discharge: 2022-03-28 | Disposition: A | Discharge status: Home / Self Care | Payer: 59 | Attending: Internal Medicine | Admitting: Internal Medicine

## 2022-03-28 DIAGNOSIS — J029 Acute pharyngitis, unspecified: Secondary | ICD-10-CM | POA: Diagnosis not present

## 2022-03-28 DIAGNOSIS — J039 Acute tonsillitis, unspecified: Secondary | ICD-10-CM | POA: Diagnosis not present

## 2022-03-28 LAB — POCT RAPID STREP A (OFFICE): Rapid Strep A Screen: NEGATIVE

## 2022-03-28 MED ORDER — AMOXICILLIN-POT CLAVULANATE 875-125 MG PO TABS: 1.0000 | ORAL_TABLET | Freq: Two times a day (BID) | ORAL | 0 refills | Status: AC

## 2022-03-28 MED ORDER — DEXAMETHASONE SODIUM PHOSPHATE 10 MG/ML IJ SOLN
10.0000 mg | Freq: Once | INTRAMUSCULAR | Status: AC
  Administered 2022-03-28: 10 mg via INTRAMUSCULAR

## 2022-03-28 NOTE — ED Triage Notes (Signed)
Pt states had an endoscopy done 2 days ago and now having sore throat with feeling of swelling to throat. States having difficulty breathing at times but worse at night. States unable to eat d/t pain. Denies taken any meds for pain. No distress noted.

## 2022-03-29 LAB — CULTURE, GROUP A STREP (THRC)

## 2022-06-21 ENCOUNTER — Emergency Department (HOSPITAL_COMMUNITY)
Admission: EM | Admit: 2022-06-21 | Discharge: 2022-06-21 | Disposition: A | Discharge status: Home / Self Care | Payer: 59 | Attending: Emergency Medicine | Admitting: Emergency Medicine

## 2022-06-21 ENCOUNTER — Emergency Department (HOSPITAL_COMMUNITY): Payer: 59

## 2022-06-21 ENCOUNTER — Other Ambulatory Visit: Payer: Self-pay

## 2022-06-21 ENCOUNTER — Encounter (HOSPITAL_COMMUNITY): Payer: Self-pay | Admitting: Emergency Medicine

## 2022-06-21 DIAGNOSIS — Z23 Encounter for immunization: Secondary | ICD-10-CM | POA: Insufficient documentation

## 2022-06-21 DIAGNOSIS — Y92410 Unspecified street and highway as the place of occurrence of the external cause: Secondary | ICD-10-CM | POA: Diagnosis not present

## 2022-06-21 DIAGNOSIS — M79662 Pain in left lower leg: Secondary | ICD-10-CM | POA: Insufficient documentation

## 2022-06-21 DIAGNOSIS — M25512 Pain in left shoulder: Secondary | ICD-10-CM | POA: Insufficient documentation

## 2022-06-21 HISTORY — DX: Type 2 diabetes mellitus without complications: E11.9

## 2022-06-21 HISTORY — DX: Essential (primary) hypertension: I10

## 2022-06-21 MED ORDER — OXYCODONE-ACETAMINOPHEN 5-325 MG PO TABS
1.0000 | ORAL_TABLET | Freq: Once | ORAL | Status: AC
  Administered 2022-06-21: 1 via ORAL
  Filled 2022-06-21: qty 1

## 2022-06-21 MED ORDER — NAPROXEN 500 MG PO TABS: 500.0000 mg | ORAL_TABLET | Freq: Two times a day (BID) | ORAL | 0 refills | Status: AC

## 2022-06-21 MED ORDER — TETANUS-DIPHTH-ACELL PERTUSSIS 5-2.5-18.5 LF-MCG/0.5 IM SUSY
0.5000 mL | PREFILLED_SYRINGE | Freq: Once | INTRAMUSCULAR | Status: AC
  Administered 2022-06-21: 0.5 mL via INTRAMUSCULAR
  Filled 2022-06-21: qty 0.5

## 2022-06-21 NOTE — Progress Notes (Signed)
Orthopedic Tech Progress Note Patient Details:  Reginald Black [birth year] [id number]  Ortho Devices Type of Ortho Device: Shoulder immobilizer Ortho Device/Splint Location: LUE Ortho Device/Splint Interventions: Application   Post Interventions Patient Tolerated: Well  Reginald Black  06/21/2022, 12:48 PM
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
## 2022-06-21 NOTE — Discharge Instructions (Signed)
Your x-ray did not show any acute fractures.  You were provided with a prescription for anti-inflammatories to help with your pain.  Please take 1 tablet twice a day with food for the next 7 days.  Please schedule an appointment with Dr. Ninfa Linden in order to obtain further management of your left shoulder pain.
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
## 2023-01-18 ENCOUNTER — Other Ambulatory Visit: Payer: Self-pay | Admitting: Rehabilitation

## 2023-01-18 DIAGNOSIS — M5416 Radiculopathy, lumbar region: Secondary | ICD-10-CM

## 2023-02-11 ENCOUNTER — Ambulatory Visit
Admission: RE | Admit: 2023-02-11 | Discharge: 2023-02-11 | Disposition: A | Discharge status: Home / Self Care | Payer: No Typology Code available for payment source | Source: Ambulatory Visit | Attending: Rehabilitation | Admitting: Rehabilitation

## 2023-02-11 DIAGNOSIS — M5416 Radiculopathy, lumbar region: Secondary | ICD-10-CM

## 2023-03-31 ENCOUNTER — Other Ambulatory Visit (HOSPITAL_BASED_OUTPATIENT_CLINIC_OR_DEPARTMENT_OTHER): Payer: Self-pay

## 2023-03-31 MED ORDER — WEGOVY 0.25 MG/0.5ML ~~LOC~~ SOAJ
0.2500 mg | SUBCUTANEOUS | 0 refills | Status: AC
  Filled 2023-03-31: qty 2, 28d supply, fill #0

## 2023-04-02 ENCOUNTER — Other Ambulatory Visit (HOSPITAL_BASED_OUTPATIENT_CLINIC_OR_DEPARTMENT_OTHER): Payer: Self-pay

## 2023-04-02 MED ORDER — WEGOVY 0.25 MG/0.5ML ~~LOC~~ SOAJ
0.2500 mg | SUBCUTANEOUS | 0 refills | Status: AC
  Filled 2023-04-02: qty 2, 28d supply, fill #0

## 2023-04-10 ENCOUNTER — Other Ambulatory Visit (HOSPITAL_BASED_OUTPATIENT_CLINIC_OR_DEPARTMENT_OTHER): Payer: Self-pay

## 2023-05-19 ENCOUNTER — Other Ambulatory Visit: Payer: Self-pay

## 2023-05-19 ENCOUNTER — Encounter (HOSPITAL_COMMUNITY): Payer: Self-pay

## 2023-05-19 ENCOUNTER — Emergency Department (HOSPITAL_COMMUNITY)
Admission: EM | Admit: 2023-05-19 | Discharge: 2023-05-19 | Disposition: A | Discharge status: Home / Self Care | Payer: BC Managed Care – PPO | Attending: Emergency Medicine | Admitting: Emergency Medicine

## 2023-05-19 DIAGNOSIS — G8929 Other chronic pain: Secondary | ICD-10-CM | POA: Insufficient documentation

## 2023-05-19 DIAGNOSIS — M5442 Lumbago with sciatica, left side: Secondary | ICD-10-CM | POA: Insufficient documentation

## 2023-05-19 DIAGNOSIS — Z79899 Other long term (current) drug therapy: Secondary | ICD-10-CM | POA: Insufficient documentation

## 2023-05-19 DIAGNOSIS — M545 Low back pain, unspecified: Secondary | ICD-10-CM | POA: Diagnosis present

## 2023-05-19 DIAGNOSIS — M5432 Sciatica, left side: Secondary | ICD-10-CM

## 2023-05-19 MED ORDER — DEXAMETHASONE SODIUM PHOSPHATE 10 MG/ML IJ SOLN
10.0000 mg | Freq: Once | INTRAMUSCULAR | Status: AC
  Administered 2023-05-19: 10 mg via INTRAMUSCULAR
  Filled 2023-05-19: qty 1

## 2023-05-19 MED ORDER — HYDROMORPHONE HCL 1 MG/ML IJ SOLN
1.0000 mg | Freq: Once | INTRAMUSCULAR | Status: AC
  Administered 2023-05-19: 1 mg via INTRAMUSCULAR
  Filled 2023-05-19: qty 1

## 2023-05-19 MED ORDER — KETOROLAC TROMETHAMINE 60 MG/2ML IM SOLN
60.0000 mg | Freq: Once | INTRAMUSCULAR | Status: AC
  Administered 2023-05-19: 60 mg via INTRAMUSCULAR
  Filled 2023-05-19: qty 2

## 2023-05-19 MED ORDER — OXYCODONE-ACETAMINOPHEN 5-325 MG PO TABS: 1.0000 | ORAL_TABLET | Freq: Four times a day (QID) | ORAL | 0 refills | Status: AC | PRN

## 2023-05-19 NOTE — ED Notes (Signed)
Pt says heat brings the pain on and ice subsides it somewhat.

## 2023-05-19 NOTE — ED Notes (Signed)
Pt was able to take 10 mg 5-325 oxycodone lasted 2 hours. Ibuprofen lasted 20 minutes.Pt has a sharp intense pain that shoots. It starts with any movement.

## 2023-05-19 NOTE — ED Provider Notes (Signed)
[place] [facility]ERGENCY DEPARTMENT AT [facility] Provider Note   CSN: [id number] Arrival date & time: 05/19/23  0456     History  Chief Complaint  Patient presents with   left hip pain   Leg Pain    Reginald Black is a 42 y.o. male.  HPI Patient reports that he is getting routine steroid injections for sciatica on the left side.  He is due again for injections in about 1 week.  He reports the last set were not helpful.  He reports pain starts in his lower back and radiates into the hip with sharp aching pain and sometimes a sharp stabbing pain goes all the way to the ankle.  He reports the only time he gets pain relief is if he takes 2 Percocet 3-4 times a day.  He reports he just finished a course of prednisone a week ago.  He reports he was told if steroid injections in the low back were not effective this time, they would have to discuss surgical management for pain control.    Home Medications Prior to Admission medications   Medication Sig Start Date End Date Taking? Authorizing Provider  oxyCODONE-acetaminophen (PERCOCET/ROXICET) 5-325 MG tablet Take 1-2 tablets by mouth every 6 (six) hours as needed for severe pain. 05/19/23  Yes Arby Barrette, MD  albuterol (PROVENTIL HFA;VENTOLIN HFA) 108 (90 Base) MCG/ACT inhaler Inhale 2 puffs into the lungs every 4 (four) hours as needed for wheezing or shortness of breath. 10/22/15   Gilda Crease, MD  amLODipine (NORVASC) 5 MG tablet Take 5 mg by mouth daily. 12/12/21   [provider]  amoxicillin-clavulanate (AUGMENTIN) 875-125 MG tablet Take 1 tablet by mouth every 12 (twelve) hours. 03/28/22   Gustavus Bryant, FNP  ibuprofen (ADVIL,MOTRIN) 200 MG tablet Take 1,600 mg by mouth every 6 (six) hours as needed for moderate pain.    [provider]  irbesartan-hydrochlorothiazide (AVALIDE) 300-12.5 MG tablet Take 1 tablet by mouth daily. 01/13/22   [provider]  lidocaine (LIDODERM) 5 % Place 1  patch onto the skin daily. Remove & Discard patch within 12 hours or as directed by MD 12/22/19   Tanda Rockers, PA-C  metoprolol succinate (TOPROL-XL) 25 MG 24 hr tablet Take 25 mg by mouth at bedtime. 01/13/22   [provider]  Multiple Vitamin (THERA) TABS Take 1 tablet by mouth daily.    [provider]  niacin 500 MG tablet Take 1 tablet by mouth daily.    [provider]  Semaglutide-Weight Management (WEGOVY) 0.25 MG/0.5ML SOAJ Inject 0.25 mg into the skin once a week. 03/31/23     Semaglutide-Weight Management (WEGOVY) 0.25 MG/0.5ML SOAJ Inject 0.25 mg into the skin once a week. 04/01/23     vitamin B-12 (CYANOCOBALAMIN) 1000 MCG tablet Take 1 tablet by mouth daily.    [provider]  fluticasone (FLONASE) 50 MCG/ACT nasal spray Place 2 sprays into both nostrils daily. 10/23/17 11/25/19  Loren Racer, MD  loratadine (CLARITIN) 10 MG tablet Take 1 tablet (10 mg total) by mouth daily. 10/23/17 11/25/19  Loren Racer, MD      Allergies    Codeine    Review of Systems   Review of Systems  Physical Exam Updated Vital Signs BP 136/86 (BP Location: Right Arm)   Pulse 62   Temp 98.6 F (37 C) (Oral)   Resp 20   SpO2 98%  Physical Exam Constitutional:      Comments: Alert nontoxic sitting  up in the bed.  No respiratory distress.  Cardiovascular:     Rate and Rhythm: Normal rate and regular rhythm.  Pulmonary:     Effort: Pulmonary effort is normal.     Breath sounds: Normal breath sounds.  Musculoskeletal:     Comments: Visual infection of lower back normal.  No rashes or soft tissue abnormalities.  No significant reproducible pain to palpation along the bony prominences of the spine.  Lower legs are symmetric.  There is no peripheral edema.  Calves are soft nontender.  No edema of the feet.  Feet are warm and dry with 2+ dorsalis pedis pulse.  Skin:    General: Skin is warm and dry.  Neurological:     General: No focal deficit present.      Mental Status: He is oriented to person, place, and time.     Motor: No weakness.     Coordination: Coordination normal.     ED Results / Procedures / Treatments   Labs (all labs ordered are listed, but only abnormal results are displayed) Labs Reviewed - No data to display  EKG None  Radiology No results found.  Procedures Procedures    Medications Ordered in ED Medications  HYDROmorphone (DILAUDID) injection 1 mg (1 mg Intramuscular Given 05/19/23 0805)  ketorolac (TORADOL) injection 60 mg (60 mg Intramuscular Given 05/19/23 0805)  dexamethasone (DECADRON) injection 10 mg (10 mg Intramuscular Given 05/19/23 1610)    ED Course/ Medical Decision Making/ A&P                                 Medical Decision Making Risk Prescription drug management.   Patient presents with long history of sciatica being managed by combination of oral narcotic pain control and steroid injections.  Patient is established with pain management and neurosurgery.  At this time he does not appear to have any acute dysfunction.  He is having difficulty with pain control.  Patient's wife reports that he is staying up all night in pain and keeping her up as well.  At this time we will address acute pain with IM shot of Toradol and Dilaudid.  Will give 1 IM shot of Decadron.  Patient has follow-up within a week.   I will prescribe for Percocet to take over the weekend as he reports adequate pain control with 2 Percocet every 4-6 hours.  But patient is advised changes in his pain management routine or changes to oxycodone IR need to be done in the setting of his current providers.  He voices understanding.        Final Clinical Impression(s) / ED Diagnoses Final diagnoses:  Left sided sciatica  Acute exacerbation of chronic low back pain    Rx / DC Orders ED Discharge Orders          Ordered    oxyCODONE-acetaminophen (PERCOCET/ROXICET) 5-325 MG tablet  Every 6 hours PRN        05/19/23 0824               Arby Barrette, MD 05/19/23 0825

## 2023-05-19 NOTE — ED Provider Notes (Signed)
7:03 Not in Room   Arby Barrette, MD 05/19/23 [phone]

## 2023-05-19 NOTE — Discharge Instructions (Signed)
1.  Follow-up with your spine center Monday to discuss pain control.  At this time, you described an adequate pain control and this needs to be addressed through your outpatient providers for evaluation of changes in pain management or surgical intervention.

## 2023-05-19 NOTE — ED Triage Notes (Signed)
Pt arrived from home via POV c/o left hip pain that goes in to left side of groin and down to thigh. Pt states that it is so unbearable at times that he is unable to move. Pt states that it feels like his hip is popping out of joint.

## 2023-05-31 ENCOUNTER — Emergency Department (HOSPITAL_COMMUNITY)
Admission: EM | Admit: 2023-05-31 | Discharge: 2023-05-31 | Disposition: A | Discharge status: Home / Self Care | Payer: BC Managed Care – PPO | Attending: Emergency Medicine | Admitting: Emergency Medicine

## 2023-05-31 ENCOUNTER — Emergency Department (HOSPITAL_COMMUNITY): Payer: BC Managed Care – PPO

## 2023-05-31 ENCOUNTER — Encounter (HOSPITAL_COMMUNITY): Payer: Self-pay

## 2023-05-31 DIAGNOSIS — M25552 Pain in left hip: Secondary | ICD-10-CM | POA: Diagnosis present

## 2023-05-31 DIAGNOSIS — I10 Essential (primary) hypertension: Secondary | ICD-10-CM | POA: Insufficient documentation

## 2023-05-31 DIAGNOSIS — M5432 Sciatica, left side: Secondary | ICD-10-CM | POA: Insufficient documentation

## 2023-05-31 DIAGNOSIS — E119 Type 2 diabetes mellitus without complications: Secondary | ICD-10-CM | POA: Diagnosis not present

## 2023-05-31 DIAGNOSIS — Z79899 Other long term (current) drug therapy: Secondary | ICD-10-CM | POA: Insufficient documentation

## 2023-05-31 MED ORDER — CAPSAICIN 0.05 % EX CREA: TOPICAL_CREAM | CUTANEOUS | 0 refills | Status: AC

## 2023-05-31 MED ORDER — METHOCARBAMOL 500 MG PO TABS: 750.0000 mg | ORAL_TABLET | Freq: Two times a day (BID) | ORAL | 0 refills | Status: AC

## 2023-05-31 MED ORDER — HYDROMORPHONE HCL 1 MG/ML IJ SOLN
1.0000 mg | Freq: Once | INTRAMUSCULAR | Status: AC
  Administered 2023-05-31: 1 mg via INTRAMUSCULAR
  Filled 2023-05-31: qty 1

## 2023-05-31 MED ORDER — KETOROLAC TROMETHAMINE 30 MG/ML IJ SOLN
30.0000 mg | Freq: Once | INTRAMUSCULAR | Status: AC
  Administered 2023-05-31: 30 mg via INTRAMUSCULAR
  Filled 2023-05-31: qty 1

## 2023-05-31 MED ORDER — GABAPENTIN 600 MG PO TABS: 600.0000 mg | ORAL_TABLET | Freq: Three times a day (TID) | ORAL | 0 refills | Status: AC

## 2023-05-31 NOTE — ED Triage Notes (Signed)
Pt presents with c/o left hip pain. Pt reports he has been getting injections for his back since early August and once the injections started, the pain in his hip has started. Pt denies any injury.

## 2023-05-31 NOTE — Discharge Instructions (Addendum)
As discussed, your imaging did not show any signs of injury or arthritic changes to your left hip. I have sent a prescription for gabapentin to your pharmacy. You can take this up to 3 times a day as needed. Take Robaxin (muscle relaxer) twice a day as needed for pain. I have sent in Capsaicin cream as well which you can apply to the area of pain as needed. Take Ibuprofen or Advil once a day for inflammation.  Follow up with your spine doctor in the next 3-5 days for reevaluation of your symptoms.   Get help right away if: You cannot control when you pee (urinate) or poop (have a bowel movement). You have weakness in any of these areas and it gets worse: Lower back. The area between your hip bones. Butt. Legs. You have redness or swelling of your back. You have a burning feeling when you pee.

## 2023-05-31 NOTE — ED Provider Notes (Signed)
[place] EMERGENCY DEPARTMENT AT [facility] Provider Note   CSN: [id number] Arrival date & time: 05/31/23  0848     History  Chief Complaint  Patient presents with   Hip Pain    Reginald Black is a 42 y.o. male with a history of diabetes mellitus, hypertension, and chronic back pain who presents to the ED today for hip pain. Patient reports pain in the left hip for the past month or so. He is getting injections in his lumbar spine with his orthopedic provider.  Patient reports relief of low back pain and now has always had the left hip pain where he just noticed now since his back pain has improved.  Additionally, patient reports shooting pain down his left leg and chronic left knee pain.  Patient reports he is able to walk but has a lot of pain with ambulation.  Denies weakness or loss of sensation to the lower extremities.  Maintains function of bladder and bowels.  Denies fever and chills.  He has tried NSAIDs, lidocaine, and oxycodone for pain without relief.  He was seen at 2 weeks ago in the ED for similar concerns and denies improvement of symptoms with steroid use.  He is supposed to follow-up with his orthopedic provider in 2 weeks but unable to tolerate the pain on his own until then.    Home Medications Prior to Admission medications   Medication Sig Start Date End Date Taking? Authorizing Provider  Capsaicin 0.05 % CREA Apply to area of pain 05/31/23  Yes Maxwell Marion, PA-C  gabapentin (NEURONTIN) 600 MG tablet Take 1 tablet (600 mg total) by mouth 3 (three) times daily for 14 days. 05/31/23 06/14/23 Yes Maxwell Marion, PA-C  methocarbamol (ROBAXIN) 500 MG tablet Take 1.5 tablets (750 mg total) by mouth 2 (two) times daily for 14 days. 05/31/23 06/14/23 Yes Maxwell Marion, PA-C  albuterol (PROVENTIL HFA;VENTOLIN HFA) 108 (90 Base) MCG/ACT inhaler Inhale 2 puffs into the lungs every 4 (four) hours as needed for wheezing or shortness of breath. 10/22/15   Gilda Crease, MD  amLODipine (NORVASC) 5 MG tablet Take 5 mg by mouth daily. 12/12/21   [provider]  amoxicillin-clavulanate (AUGMENTIN) 875-125 MG tablet Take 1 tablet by mouth every 12 (twelve) hours. 03/28/22   Gustavus Bryant, FNP  ibuprofen (ADVIL,MOTRIN) 200 MG tablet Take 1,600 mg by mouth every 6 (six) hours as needed for moderate pain.    [provider]  irbesartan-hydrochlorothiazide (AVALIDE) 300-12.5 MG tablet Take 1 tablet by mouth daily. 01/13/22   [provider]  lidocaine (LIDODERM) 5 % Place 1 patch onto the skin daily. Remove & Discard patch within 12 hours or as directed by MD 12/22/19   Tanda Rockers, PA-C  metoprolol succinate (TOPROL-XL) 25 MG 24 hr tablet Take 25 mg by mouth at bedtime. 01/13/22   [provider]  Multiple Vitamin (THERA) TABS Take 1 tablet by mouth daily.    [provider]  niacin 500 MG tablet Take 1 tablet by mouth daily.    [provider]  oxyCODONE-acetaminophen (PERCOCET/ROXICET) 5-325 MG tablet Take 1-2 tablets by mouth every 6 (six) hours as needed for severe pain. 05/19/23   Arby Barrette, MD  Semaglutide-Weight Management (WEGOVY) 0.25 MG/0.5ML SOAJ Inject 0.25 mg into the skin once a week. 03/31/23     Semaglutide-Weight Management (WEGOVY) 0.25 MG/0.5ML SOAJ Inject 0.25 mg into the skin once a week. 04/01/23     vitamin B-12 (CYANOCOBALAMIN) 1000  MCG tablet Take 1 tablet by mouth daily.    [provider]  fluticasone (FLONASE) 50 MCG/ACT nasal spray Place 2 sprays into both nostrils daily. 10/23/17 11/25/19  Loren Racer, MD  loratadine (CLARITIN) 10 MG tablet Take 1 tablet (10 mg total) by mouth daily. 10/23/17 11/25/19  Loren Racer, MD      Allergies    Codeine    Review of Systems   Review of Systems  Musculoskeletal:        Left hip pain  All other systems reviewed and are negative.   Physical Exam Updated Vital Signs BP 120/80   Pulse 70   Temp 98.5 F (36.9  C) (Oral)   Resp 17   SpO2 98%  Physical Exam Vitals and nursing note reviewed.  Constitutional:      Appearance: Normal appearance.  HENT:     Head: Normocephalic and atraumatic.     Mouth/Throat:     Mouth: Mucous membranes are moist.  Eyes:     Conjunctiva/sclera: Conjunctivae normal.     Pupils: Pupils are equal, round, and reactive to light.  Cardiovascular:     Rate and Rhythm: Normal rate and regular rhythm.     Pulses: Normal pulses.  Pulmonary:     Effort: Pulmonary effort is normal.     Breath sounds: Normal breath sounds.  Abdominal:     Palpations: Abdomen is soft.     Tenderness: There is no abdominal tenderness.  Musculoskeletal:        General: Tenderness present. Normal range of motion.     Comments: Tenderness to the left lateral hip.  Lower extremity strength and sensation is intact.   Skin:    General: Skin is warm and dry.     Findings: No rash.  Neurological:     General: No focal deficit present.     Mental Status: He is alert.     Sensory: No sensory deficit.     Motor: No weakness.  Psychiatric:        Mood and Affect: Mood normal.        Behavior: Behavior normal.     ED Results / Procedures / Treatments   Labs (all labs ordered are listed, but only abnormal results are displayed) Labs Reviewed - No data to display  EKG None  Radiology DG Hip Unilat W or Wo Pelvis 2-3 Views Left  Result Date: 05/31/2023 CLINICAL DATA:  Hip pain EXAM: DG HIP (WITH OR WITHOUT PELVIS) 3V LEFT COMPARISON:  None Available. FINDINGS: There is no evidence of hip fracture or dislocation. There is no evidence of arthropathy or other focal bone abnormality. IMPRESSION: No acute osseous abnormality Electronically Signed   By: Karen Kays M.D.   On: 05/31/2023 12:03    Procedures Procedures: not indicated.   Medications Ordered in ED Medications  ketorolac (TORADOL) 30 MG/ML injection 30 mg (30 mg Intramuscular Given 05/31/23 1037)  HYDROmorphone (DILAUDID)  injection 1 mg (1 mg Intramuscular Given 05/31/23 1239)    ED Course/ Medical Decision Making/ A&P                                 Medical Decision Making Amount and/or Complexity of Data Reviewed Radiology: ordered.  Risk Prescription drug management.   This patient presents to the ED for concern of left hip pain, this involves an extensive number of treatment options, and is a complaint that carries with it  a high risk of complications and morbidity.   Differential diagnosis includes: fracture, dislocation, contusion, muscle strain, sciatic nerve pain, etc.   Comorbidities  See HPI above   Additional History  Additional history obtained from previous ED records.   Imaging Studies  I ordered imaging studies including left hip x-ray  I independently visualized and interpreted imaging which showed: no acute osseous abnormality. I agree with the radiologist interpretation   Problem List / ED Course / Critical Interventions / Medication Management  Left hip pain I ordered medications including: Toradol then Dilaudid IM for pain  Reevaluation of the patient after these medicines showed that the patient improved I have reviewed the patients home medicines and have made adjustments as needed   Social Determinants of Health  Physical activity   Test / Admission - Considered  Discussed results with patient. He is hemodynamically stable and safe for discharge home. Prescription for Robaxin and gabapentin sent to the pharmacy. Return precautions provided.       Final Clinical Impression(s) / ED Diagnoses Final diagnoses:  Sciatica of left side    Rx / DC Orders ED Discharge Orders          Ordered    gabapentin (NEURONTIN) 600 MG tablet  3 times daily        05/31/23 1306    methocarbamol (ROBAXIN) 500 MG tablet  2 times daily        05/31/23 1306    Capsaicin 0.05 % CREA        05/31/23 1306              Maxwell Marion, PA-C 05/31/23  1404    Pricilla Loveless, MD 06/05/23 [phone]
